# Patient Record
Sex: Male | Born: 1965 | Race: White | Hispanic: No | Marital: Single | Smoking: Current every day smoker
Health system: Southern US, Community
[De-identification: ages and names within clinical notes are randomized; demographics above are authoritative.]

## PROBLEM LIST (undated history)

## (undated) DIAGNOSIS — I10 Essential (primary) hypertension: Secondary | ICD-10-CM

## (undated) DIAGNOSIS — M519 Unspecified thoracic, thoracolumbar and lumbosacral intervertebral disc disorder: Secondary | ICD-10-CM

## (undated) DIAGNOSIS — E669 Obesity, unspecified: Secondary | ICD-10-CM

## (undated) DIAGNOSIS — G4733 Obstructive sleep apnea (adult) (pediatric): Secondary | ICD-10-CM

## (undated) DIAGNOSIS — E785 Hyperlipidemia, unspecified: Secondary | ICD-10-CM

## (undated) HISTORY — DX: Hyperlipidemia, unspecified: E78.5

## (undated) HISTORY — DX: Unspecified thoracic, thoracolumbar and lumbosacral intervertebral disc disorder: M51.9

## (undated) HISTORY — PX: HEMORROIDECTOMY: SUR656

## (undated) HISTORY — DX: Obstructive sleep apnea (adult) (pediatric): G47.33

## (undated) HISTORY — DX: Essential (primary) hypertension: I10

## (undated) HISTORY — DX: Obesity, unspecified: E66.9

---

## 2003-01-28 ENCOUNTER — Encounter (INDEPENDENT_AMBULATORY_CARE_PROVIDER_SITE_OTHER): Payer: Self-pay | Admitting: Specialist

## 2003-01-28 ENCOUNTER — Observation Stay (HOSPITAL_COMMUNITY): Admission: EM | Admit: 2003-01-28 | Discharge: 2003-01-29 | Payer: Self-pay | Admitting: Emergency Medicine

## 2009-08-19 ENCOUNTER — Ambulatory Visit (HOSPITAL_COMMUNITY): Admission: RE | Admit: 2009-08-19 | Discharge: 2009-08-19 | Payer: Self-pay | Admitting: Gastroenterology

## 2010-11-19 ENCOUNTER — Emergency Department (HOSPITAL_COMMUNITY)
Admission: EM | Admit: 2010-11-19 | Discharge: 2010-11-19 | Payer: Self-pay | Source: Home / Self Care | Admitting: Emergency Medicine

## 2010-11-22 LAB — CBC
HCT: 45.8 % (ref 39.0–52.0)
Hemoglobin: 16 g/dL (ref 13.0–17.0)
MCH: 32.7 pg (ref 26.0–34.0)
MCHC: 34.9 g/dL (ref 30.0–36.0)
MCV: 93.7 fL (ref 78.0–100.0)
Platelets: 242 10*3/uL (ref 150–400)
RBC: 4.89 MIL/uL (ref 4.22–5.81)
RDW: 13.6 % (ref 11.5–15.5)
WBC: 15.9 10*3/uL — ABNORMAL HIGH (ref 4.0–10.5)

## 2010-11-22 LAB — DIFFERENTIAL
Basophils Absolute: 0.1 10*3/uL (ref 0.0–0.1)
Basophils Relative: 1 % (ref 0–1)
Eosinophils Absolute: 0.2 10*3/uL (ref 0.0–0.7)
Eosinophils Relative: 1 % (ref 0–5)
Lymphocytes Relative: 14 % (ref 12–46)
Lymphs Abs: 2.2 10*3/uL (ref 0.7–4.0)
Monocytes Absolute: 1.5 10*3/uL — ABNORMAL HIGH (ref 0.1–1.0)
Monocytes Relative: 9 % (ref 3–12)
Neutro Abs: 12 10*3/uL — ABNORMAL HIGH (ref 1.7–7.7)
Neutrophils Relative %: 75 % (ref 43–77)

## 2010-11-22 LAB — BASIC METABOLIC PANEL
BUN: 12 mg/dL (ref 6–23)
CO2: 27 mEq/L (ref 19–32)
Calcium: 9.8 mg/dL (ref 8.4–10.5)
Chloride: 101 mEq/L (ref 96–112)
Creatinine, Ser: 0.69 mg/dL (ref 0.4–1.5)
GFR calc Af Amer: >60 mL/min (ref >60)
GFR calc non Af Amer: >60 mL/min (ref >60)
Glucose, Bld: 102 mg/dL — ABNORMAL HIGH (ref 70–99)
Potassium: 3.7 mEq/L (ref 3.5–5.1)
Sodium: 138 mEq/L (ref 135–145)

## 2010-11-22 LAB — RAPID STREP SCREEN (MED CTR MEBANE ONLY): Streptococcus, Group A Screen (Direct): NEGATIVE

## 2011-03-17 NOTE — Op Note (Signed)
   NAME:  Matthew Valencia, Matthew Valencia NO.:  [id number]   MEDICAL RECORD NO.:  [id number]                   PATIENT TYPE:  OBV   LOCATION:  0103                                 FACILITY:  [facility]   PHYSICIAN:  Lorre Munroe., M.D.            DATE OF BIRTH:  1965-11-29   DATE OF PROCEDURE:  01/28/2003  DATE OF DISCHARGE:                                 OPERATIVE REPORT   PREOPERATIVE DIAGNOSIS:  Severe thrombosed and necrotic compound  hemorrhoids.   POSTOPERATIVE DIAGNOSIS:  Severe thrombosed and necrotic compound  hemorrhoids.   PROCEDURE:  Hemorrhoidectomy.   SURGEON:  Lebron Conners, M.D.   ANESTHESIA:  General and local.   DESCRIPTION OF PROCEDURE:  After the patient was monitored and anesthetized  and had routine preparation and draping of the perineum, I very liberally  infused long-acting local anesthetic into the perianal and intra-anal  regions and into the hemorrhoids to be removed.  There were two extremely  large, very edematous and thrombosed hemorrhoids, which were compound type  and there was necrotic anoderm and perhaps a little necrotic distal rectal  mucosa associated with each hemorrhoid.  I did a digital exam and found that  there was no impaction.  The distal rectal mucosal looked normal on  anoscopic exam.  I elliptically excised each hemorrhoid and further removed  small blood clots which remained behind on each side, and got good  hemostasis with the Bovie, cutting right down to the internal sphincter on  each side.  Then I advanced the mucosa toward the anal verge and sewed it to  the anoderm and perianal skin with 2-0 chromic suture.  I made sure that  there was no stricture of the anus due to this closure.  Hemostasis was  good.  I applied a small bandage and completed the operation after injecting  a little bit more local anesthetic.  The patient tolerated it well.                                               Lorre Munroe., M.D.    Jodi Marble  D:  01/28/2003  T:  01/29/2003  Job:  [id number]

## 2013-07-31 ENCOUNTER — Ambulatory Visit
Admission: RE | Admit: 2013-07-31 | Discharge: 2013-07-31 | Disposition: A | Discharge status: Home / Self Care | Payer: BC Managed Care – PPO | Source: Ambulatory Visit | Attending: Internal Medicine | Admitting: Internal Medicine

## 2013-07-31 ENCOUNTER — Other Ambulatory Visit: Payer: Self-pay | Admitting: Internal Medicine

## 2013-07-31 DIAGNOSIS — J189 Pneumonia, unspecified organism: Secondary | ICD-10-CM

## 2014-01-15 ENCOUNTER — Ambulatory Visit
Admission: RE | Admit: 2014-01-15 | Discharge: 2014-01-15 | Disposition: A | Discharge status: Home / Self Care | Payer: BC Managed Care – PPO | Source: Ambulatory Visit | Attending: Internal Medicine | Admitting: Internal Medicine

## 2014-01-15 ENCOUNTER — Other Ambulatory Visit: Payer: Self-pay | Admitting: Internal Medicine

## 2014-01-15 DIAGNOSIS — M549 Dorsalgia, unspecified: Secondary | ICD-10-CM

## 2014-03-26 ENCOUNTER — Other Ambulatory Visit: Payer: Self-pay | Admitting: Internal Medicine

## 2014-03-26 DIAGNOSIS — M545 Low back pain, unspecified: Secondary | ICD-10-CM

## 2014-03-30 ENCOUNTER — Ambulatory Visit
Admission: RE | Admit: 2014-03-30 | Discharge: 2014-03-30 | Disposition: A | Discharge status: Home / Self Care | Payer: BC Managed Care – PPO | Source: Ambulatory Visit | Attending: Internal Medicine | Admitting: Internal Medicine

## 2014-03-30 DIAGNOSIS — M545 Low back pain, unspecified: Secondary | ICD-10-CM

## 2016-03-13 ENCOUNTER — Other Ambulatory Visit: Payer: Self-pay | Admitting: Internal Medicine

## 2016-03-13 DIAGNOSIS — R634 Abnormal weight loss: Secondary | ICD-10-CM

## 2016-03-13 DIAGNOSIS — R142 Eructation: Secondary | ICD-10-CM

## 2016-03-13 DIAGNOSIS — R109 Unspecified abdominal pain: Secondary | ICD-10-CM

## 2016-03-13 DIAGNOSIS — R630 Anorexia: Secondary | ICD-10-CM

## 2016-03-16 ENCOUNTER — Other Ambulatory Visit: Payer: Self-pay

## 2016-03-20 ENCOUNTER — Ambulatory Visit
Admission: RE | Admit: 2016-03-20 | Discharge: 2016-03-20 | Disposition: A | Discharge status: Home / Self Care | Payer: BLUE CROSS/BLUE SHIELD | Source: Ambulatory Visit | Attending: Internal Medicine | Admitting: Internal Medicine

## 2016-03-20 DIAGNOSIS — R630 Anorexia: Secondary | ICD-10-CM

## 2016-03-20 DIAGNOSIS — R142 Eructation: Secondary | ICD-10-CM

## 2016-03-20 DIAGNOSIS — R634 Abnormal weight loss: Secondary | ICD-10-CM

## 2016-03-20 DIAGNOSIS — R109 Unspecified abdominal pain: Secondary | ICD-10-CM

## 2021-02-15 ENCOUNTER — Other Ambulatory Visit: Payer: Self-pay | Admitting: Internal Medicine

## 2021-02-15 ENCOUNTER — Ambulatory Visit
Admission: RE | Admit: 2021-02-15 | Discharge: 2021-02-15 | Disposition: A | Discharge status: Home / Self Care | Payer: BLUE CROSS/BLUE SHIELD | Source: Ambulatory Visit | Attending: Internal Medicine | Admitting: Internal Medicine

## 2021-02-15 DIAGNOSIS — I10 Essential (primary) hypertension: Secondary | ICD-10-CM | POA: Diagnosis not present

## 2021-02-15 DIAGNOSIS — Z0000 Encounter for general adult medical examination without abnormal findings: Secondary | ICD-10-CM | POA: Diagnosis not present

## 2021-02-15 DIAGNOSIS — R0602 Shortness of breath: Secondary | ICD-10-CM

## 2021-02-15 DIAGNOSIS — G4700 Insomnia, unspecified: Secondary | ICD-10-CM | POA: Diagnosis not present

## 2021-02-15 DIAGNOSIS — E782 Mixed hyperlipidemia: Secondary | ICD-10-CM | POA: Diagnosis not present

## 2021-02-15 DIAGNOSIS — M519 Unspecified thoracic, thoracolumbar and lumbosacral intervertebral disc disorder: Secondary | ICD-10-CM | POA: Diagnosis not present

## 2021-02-19 ENCOUNTER — Encounter: Payer: Self-pay | Admitting: Cardiology

## 2021-02-19 DIAGNOSIS — R0602 Shortness of breath: Secondary | ICD-10-CM | POA: Insufficient documentation

## 2021-02-19 NOTE — Progress Notes (Signed)
Cardiology Office Note   Date:  02/21/2021   ID:  Matthew Valencia, DOB 04-Sep-1966, MRN [id number]  PCP:  Matthew Housekeeper, MD  Cardiologist:   No primary care provider on file. Referring:  Matthew Housekeeper, MD  Chief Complaint  Patient presents with  . Shortness of Breath      History of Present Illness: Matthew Valencia is a 55 y.o. male who is referred for evaluation of SOB.  He was referred by Matthew Housekeeper, MD  The patient has no past cardiac history but he does have significant cardiovascular risk factors.  He did have COVID.  This was in February.  He was quite sick with this though not hospitalized.  He had had his vaccinations.  I do see a chest x-ray that was done by his primary provider with some evidence of pulmonary vascular congestion and volume overload.  This was on  02/15/2021  He said that since COVID he has had dyspnea doing things like climbing a flight of stairs.  He is more short of breath doing stuff in his yard.  He may be having a little bit of PND or orthopnea.  He was given some diuretic a few days ago but he does not think this made a significant difference.  He is not describing cough fevers or chills.  He does still smoke cigarettes.  He said he has not had any symptoms prior to COVID.  He is not describing neck or arm discomfort.  He occasionally gets some chest pressure when he climbs a flight of stairs but this is not routine.  He has never had any other cardiac testing.  They tried to do a treadmill test years ago but he is back precluded doing this.  Past Medical History:  Diagnosis Date  . Dyslipidemia   . Hypertension   . Lumbar disc disease   . Obesity   . Obstructive sleep apnea    Does not use CPAP secondary to cost    Past Surgical History:  Procedure Laterality Date  . HEMORROIDECTOMY       Current Outpatient Medications  Medication Sig Dispense Refill  . albuterol (VENTOLIN HFA) 108 (90 Base) MCG/ACT inhaler As Needed    . ALPRAZolam  (XANAX) 0.5 MG tablet Take 0.25-0.5 mg by mouth 2 (two) times daily as needed (As Needed).    Marland Kitchen escitalopram (LEXAPRO) 10 MG tablet Take 1 tablet by mouth daily.    . furosemide (LASIX) 20 MG tablet Take 20 mg by mouth daily.     No current facility-administered medications for this visit.    Allergies:   Other and Trazodone hcl    Social History:  The patient  reports that he has been smoking cigarettes. He has a 10.00 pack-year smoking history. He has never used smokeless tobacco. He reports current alcohol use. He reports that he does not use drugs.   Family History:  The patient's family history includes Coronary artery disease in an other family member; Coronary artery disease (age of onset: 70) in his father; Diabetes in his mother; Heart failure in an other family member; Lung cancer in his father.    ROS:  Please see the history of present illness.   Otherwise, review of systems are positive for back pain.   All other systems are reviewed and negative.    PHYSICAL EXAM: VS:  BP 128/90   Pulse 69   Ht 6' (1.829 m)   Wt 214 lb 3.2 oz (97.2 kg)  BMI 29.05 kg/m  , BMI Body mass index is 29.05 kg/m. GENERAL:  Well appearing HEENT:  Pupils equal round and reactive, fundi not visualized, oral mucosa unremarkable NECK:  No jugular venous distention, waveform within normal limits, carotid upstroke brisk and symmetric, no bruits, no thyromegaly LYMPHATICS:  No cervical, inguinal adenopathy LUNGS:  Clear to auscultation bilaterally BACK:  No CVA tenderness CHEST:  Unremarkable HEART:  PMI not displaced or sustained,S1 and S2 within normal limits, no S3, no S4, no clicks, no rubs, no murmurs ABD:  Flat, positive bowel sounds normal in frequency in pitch, no bruits, no rebound, no guarding, no midline pulsatile mass, no hepatomegaly, no splenomegaly EXT:  2 plus pulses throughout, no edema, no cyanosis no clubbing SKIN:  No rashes no nodules NEURO:  Cranial nerves II through XII  grossly intact, motor grossly intact throughout PSYCH:  Cognitively intact, oriented to person place and time    EKG:  EKG is ordered today. The ekg ordered today demonstrates rate 69, axis within normal limits, short PR interval with possible ectopic atrial rhythm   Recent Labs: No results found for requested labs within last 8760 hours.    Lipid Panel No results found for: CHOL, TRIG, HDL, CHOLHDL, VLDL, LDLCALC, LDLDIRECT    Wt Readings from Last 3 Encounters:  02/21/21 214 lb 3.2 oz (97.2 kg)      Other studies Reviewed: Additional studies/ records that were reviewed today include: Labs. Review of the above records demonstrates:  Please see elsewhere in the note.     ASSESSMENT AND PLAN:  SOB:   I am going to check an echocardiogram though I do not strongly suspect cardiomyopathy.  This might be a pulmonary issue and this might be suggested once we done the work-up as below.  I am also going to screen for obstructive coronary disease.  I am going to start with a coronary calcium score which will then determine further testing.  Of note he would not be able walk on a treadmill with a think I need a stress test.  Doing a calcium score will also help determine goals of therapy.  DYSLIPIDEMIA: He is going to have a lipid profile done.  His last 1 was nonfasting.  He did have elevated triglycerides of 533 with an LDL of 121.  He might need a direct LDL.  I be happy to review this and make determinations of goals of therapy based on these results.    HTN: Blood pressure is controlled.  No change in therapy.      Current medicines are reviewed at length with the patient today.  The patient does not have concerns regarding medicines.  The following changes have been made:  no change  Labs/ tests ordered today include:   Orders Placed This Encounter  Procedures  . CT CARDIAC SCORING (SELF PAY ONLY)  . EKG 12-Lead  . ECHOCARDIOGRAM COMPLETE     Disposition:   FU with me  in 6 months   Signed, Rollene Rotunda, MD  02/21/2021 12:07 PM    [facility]

## 2021-02-21 ENCOUNTER — Ambulatory Visit (INDEPENDENT_AMBULATORY_CARE_PROVIDER_SITE_OTHER): Payer: BC Managed Care – PPO | Admitting: Cardiology

## 2021-02-21 ENCOUNTER — Other Ambulatory Visit: Payer: Self-pay

## 2021-02-21 ENCOUNTER — Encounter: Payer: Self-pay | Admitting: Cardiology

## 2021-02-21 VITALS — BP 128/90 | HR 69 | Ht 72.0 in | Wt 214.2 lb

## 2021-02-21 DIAGNOSIS — R0602 Shortness of breath: Secondary | ICD-10-CM

## 2021-02-21 NOTE — Patient Instructions (Addendum)
Medication Instructions:  Your physician recommends that you continue on your current medications as directed. Please refer to the Current Medication list given to you today.  *If you need a refill on your cardiac medications before your next appointment, please call your pharmacy*   Lab Work: NONE ordered at this time of appointment   If you have labs (blood work) drawn today and your tests are completely normal, you will receive your results only by: Marland Kitchen MyChart Message (if you have MyChart) OR . A paper copy in the mail If you have any lab test that is abnormal or we need to change your treatment, we will call you to review the results.  Testing/Procedures: Your physician has requested that you have an echocardiogram. Echocardiography is a painless test that uses sound waves to create images of your heart. It provides your doctor with information about the size and shape of your heart and how well your heart's chambers and valves are working. This procedure takes approximately one hour. There are no restrictions for this procedure. This test is performed at [address], [place], [place] [address]   Please schedule for 3-4 weeks  Dr. Antoine Poche has ordered a CT coronary calcium score. This test is done at [address]. This is $99 out of pocket.   Coronary CalciumScan A coronary calcium scan is an imaging test used to look for deposits of calcium and other fatty materials (plaques) in the inner lining of the blood vessels of the heart (coronary arteries). These deposits of calcium and plaques can partly clog and narrow the coronary arteries without producing any symptoms or warning signs. This puts a person at risk for a heart attack. This test can detect these deposits before symptoms develop. Tell a health care provider about:  Any allergies you have.  All medicines you are taking, including vitamins, herbs, eye drops, creams, and over-the-counter  medicines.  Any problems you or family members have had with anesthetic medicines.  Any blood disorders you have.  Any surgeries you have had.  Any medical conditions you have.  Whether you are pregnant or may be pregnant. What are the risks? Generally, this is a safe procedure. However, problems may occur, including:  Harm to a pregnant woman and her unborn baby. This test involves the use of radiation. Radiation exposure can be dangerous to a pregnant woman and her unborn baby. If you are pregnant, you generally should not have this procedure done.  Slight increase in the risk of cancer. This is because of the radiation involved in the test. What happens before the procedure? No preparation is needed for this procedure. What happens during the procedure?  You will undress and remove any jewelry around your neck or chest.  You will put on a hospital gown.  Sticky electrodes will be placed on your chest. The electrodes will be connected to an electrocardiogram (ECG) machine to record a tracing of the electrical activity of your heart.  A CT scanner will take pictures of your heart. During this time, you will be asked to lie still and hold your breath for 2-3 seconds while a picture of your heart is being taken. The procedure may vary among health care providers and hospitals. What happens after the procedure?  You can get dressed.  You can return to your normal activities.  It is up to you to get the results of your test. Ask your health care provider, or the department that  is doing the test, when your results will be ready. Summary  A coronary calcium scan is an imaging test used to look for deposits of calcium and other fatty materials (plaques) in the inner lining of the blood vessels of the heart (coronary arteries).  Generally, this is a safe procedure. Tell your health care provider if you are pregnant or may be pregnant.  No preparation is needed for this  procedure.  A CT scanner will take pictures of your heart.  You can return to your normal activities after the scan is done. This information is not intended to replace advice given to you by your health care provider. Make sure you discuss any questions you have with your health care provider. Document Released: 04/13/2008 Document Revised: 09/04/2016 Document Reviewed: 09/04/2016 Elsevier Interactive Patient Education  2017 [employer].     Follow-Up: At [facility], you and your health needs are our priority.  As part of our continuing mission to provide you with exceptional heart care, we have created designated Provider Care Teams.  These Care Teams include your primary Cardiologist (physician) and Advanced Practice Providers (APPs -  Physician Assistants and Nurse Practitioners) who all work together to provide you with the care you need, when you need it.  We recommend signing up for the patient portal called "MyChart".  Sign up information is provided on this After Visit Summary.  MyChart is used to connect with patients for Virtual Visits (Telemedicine).  Patients are able to view lab/test results, encounter notes, upcoming appointments, etc.  Non-urgent messages can be sent to your provider as well.   To learn more about what you can do with MyChart, go to [email or web address].    Your next appointment:   6 month(s)  The format for your next appointment:   In Person  Provider:   Rollene Rotunda, MD  Other Instructions

## 2021-02-22 DIAGNOSIS — Z13220 Encounter for screening for lipoid disorders: Secondary | ICD-10-CM | POA: Diagnosis not present

## 2021-02-22 DIAGNOSIS — R0602 Shortness of breath: Secondary | ICD-10-CM | POA: Diagnosis not present

## 2021-02-22 DIAGNOSIS — Z125 Encounter for screening for malignant neoplasm of prostate: Secondary | ICD-10-CM | POA: Diagnosis not present

## 2021-03-21 ENCOUNTER — Ambulatory Visit (HOSPITAL_COMMUNITY): Payer: BC Managed Care – PPO | Attending: Cardiovascular Disease

## 2021-03-21 ENCOUNTER — Ambulatory Visit (INDEPENDENT_AMBULATORY_CARE_PROVIDER_SITE_OTHER)
Admission: RE | Admit: 2021-03-21 | Discharge: 2021-03-21 | Disposition: A | Discharge status: Home / Self Care | Payer: Self-pay | Source: Ambulatory Visit | Attending: Cardiology | Admitting: Cardiology

## 2021-03-21 ENCOUNTER — Other Ambulatory Visit: Payer: Self-pay

## 2021-03-21 DIAGNOSIS — R0602 Shortness of breath: Secondary | ICD-10-CM | POA: Diagnosis not present

## 2021-03-21 LAB — ECHOCARDIOGRAM COMPLETE
Area-P 1/2: 2.62 cm2
S' Lateral: 2.7 cm

## 2021-03-23 ENCOUNTER — Encounter: Payer: Self-pay | Admitting: *Deleted

## 2021-03-23 ENCOUNTER — Telehealth: Payer: Self-pay | Admitting: *Deleted

## 2021-03-23 DIAGNOSIS — R0602 Shortness of breath: Secondary | ICD-10-CM

## 2021-03-23 NOTE — Telephone Encounter (Signed)
Spoke with pt, aware of results, all questions answered. lexiscan scheduled and instructions mailed to the patient. Follow up scheduled

## 2021-03-23 NOTE — Telephone Encounter (Addendum)
-----   Message from Rollene Rotunda, MD sent at 03/21/2021  9:45 PM EDT ----- Echo shows a normal EF.  No significant valvular abnormalities.  See results of coronary calcium score.  Call Mr. Grissinger with the results and send results to [name], MD   Rollene Rotunda, MD  Regis Bill B, LPN Elevated coronary calcium. He has this and SOB. Please schedule a Lexiscan Myoview. Please make sure he has follow up with me after this. Call Mr. Pasion with the results and send results to [name], MD    Left message for pt to call

## 2021-03-24 DIAGNOSIS — M5412 Radiculopathy, cervical region: Secondary | ICD-10-CM | POA: Diagnosis not present

## 2021-04-05 ENCOUNTER — Telehealth (HOSPITAL_COMMUNITY): Payer: Self-pay | Admitting: *Deleted

## 2021-04-05 ENCOUNTER — Encounter (HOSPITAL_COMMUNITY): Payer: Self-pay | Admitting: *Deleted

## 2021-04-05 NOTE — Telephone Encounter (Signed)
Instruction letter sent via [employer] outlining instructions for upcoming stress test on 04/11/21 @ 7:45.

## 2021-04-11 ENCOUNTER — Ambulatory Visit (HOSPITAL_COMMUNITY): Payer: BC Managed Care – PPO | Attending: Cardiology

## 2021-04-11 ENCOUNTER — Other Ambulatory Visit: Payer: Self-pay

## 2021-04-11 DIAGNOSIS — R0602 Shortness of breath: Secondary | ICD-10-CM | POA: Diagnosis not present

## 2021-04-11 LAB — MYOCARDIAL PERFUSION IMAGING
LV dias vol: 72 mL (ref 62–150)
LV sys vol: 31 mL
Peak HR: 93 {beats}/min
Rest HR: 71 {beats}/min
SDS: 0
SRS: 0
SSS: 0
TID: 0.94

## 2021-04-11 MED ORDER — TECHNETIUM TC 99M TETROFOSMIN IV KIT
11.0000 | PACK | Freq: Once | INTRAVENOUS | Status: AC | PRN
  Administered 2021-04-11: 11 via INTRAVENOUS
  Filled 2021-04-11: qty 11

## 2021-04-11 MED ORDER — REGADENOSON 0.4 MG/5ML IV SOLN
0.4000 mg | Freq: Once | INTRAVENOUS | Status: AC
  Administered 2021-04-11: 0.4 mg via INTRAVENOUS

## 2021-04-11 MED ORDER — TECHNETIUM TC 99M TETROFOSMIN IV KIT
30.4000 | PACK | Freq: Once | INTRAVENOUS | Status: AC | PRN
  Administered 2021-04-11: 30.4 via INTRAVENOUS
  Filled 2021-04-11: qty 31

## 2021-04-11 MED ORDER — AMINOPHYLLINE 25 MG/ML IV SOLN
75.0000 mg | Freq: Once | INTRAVENOUS | Status: AC
  Administered 2021-04-11: 75 mg via INTRAVENOUS

## 2021-04-13 ENCOUNTER — Telehealth: Payer: Self-pay | Admitting: *Deleted

## 2021-04-13 ENCOUNTER — Other Ambulatory Visit: Payer: Self-pay | Admitting: *Deleted

## 2021-04-13 DIAGNOSIS — R931 Abnormal findings on diagnostic imaging of heart and coronary circulation: Secondary | ICD-10-CM

## 2021-04-13 NOTE — Progress Notes (Signed)
lipid

## 2021-04-13 NOTE — Telephone Encounter (Addendum)
-----   Message from Rollene Rotunda, MD sent at 04/13/2021  8:08 AM EDT ----- No evidence of ischemia.  EF was normal on echo.  I would like to see his most recent lipid profile.  He had significant calcium.   Did he get lipids done?  Call Mr. Dacosta with the results and send results to [name], MD   Left message for pt to call  Order placed for lipid panel and slip mailed to the patient.

## 2021-04-13 NOTE — Telephone Encounter (Signed)
pt aware of results  He reports he had lab work about 2 weeks ago with his medical doctor.  Will call and get results faxed to [place].

## 2021-04-17 DIAGNOSIS — I10 Essential (primary) hypertension: Secondary | ICD-10-CM | POA: Insufficient documentation

## 2021-04-17 DIAGNOSIS — E785 Hyperlipidemia, unspecified: Secondary | ICD-10-CM | POA: Insufficient documentation

## 2021-04-17 NOTE — Progress Notes (Signed)
Cardiology Office Note   Date:  04/18/2021   ID:  Matthew Valencia, DOB April 04, 1966, MRN [id number]  PCP:  [name], MD  Cardiologist:   None Referring:  [name], MD  Chief Complaint  Patient presents with   Shortness of Breath       History of Present Illness: Matthew Valencia is a 55 y.o. male who is referred for evaluation of SOB.  He was referred by [name], MD  The patient has no past cardiac history but he does have significant cardiovascular risk factors.  He did have COVID.  This was in February.  He was quite sick with this though not hospitalized.  He had had his vaccinations.  I do see a chest x-ray that was done by his primary provider with some evidence of pulmonary vascular congestion and volume overload.  This was on  02/15/2021  He has had post COVID dyspnea.  Coronary calcium score was elevated at the 94th percentile.    There was a small defect of mild severity in the anteroseptal location.  This was not reversible.  Echo showed a NL EF and no other abnormalities.    He continues to get short of breath with activities such as climbing a flight of stairs or walking moderate speed on level ground.  He is not having any new shortness of breath, PND or orthopnea.  He is not having any new palpitations, presyncope or syncope.  He is having no cough fevers or chills.   Past Medical History:  Diagnosis Date   Dyslipidemia    Hypertension    Lumbar disc disease    Obesity    Obstructive sleep apnea    Does not use CPAP secondary to cost    Past Surgical History:  Procedure Laterality Date   HEMORROIDECTOMY       Current Outpatient Medications  Medication Sig Dispense Refill   albuterol (VENTOLIN HFA) 108 (90 Base) MCG/ACT inhaler As Needed     ALPRAZolam (XANAX) 0.5 MG tablet Take 0.25-0.5 mg by mouth 2 (two) times daily as needed (As Needed).     atorvastatin (LIPITOR) 40 MG tablet Take 1 tablet (40 mg total) by mouth daily. 90 tablet 3    escitalopram (LEXAPRO) 10 MG tablet Take 1 tablet by mouth daily.     furosemide (LASIX) 20 MG tablet Take 20 mg by mouth daily.     No current facility-administered medications for this visit.    Allergies:   Other and Trazodone hcl    ROS:  Please see the history of present illness.   Otherwise, review of systems are positive for back none.   All other systems are reviewed and negative.    PHYSICAL EXAM: VS:  BP (!) 150/84   Pulse 76   Ht 6' (1.829 m)   Wt 213 lb 9.6 oz (96.9 kg)   SpO2 95%   BMI 28.97 kg/m  , BMI Body mass index is 28.97 kg/m. GENERAL:  Well appearing NECK:  No jugular venous distention, waveform within normal limits, carotid upstroke brisk and symmetric, no bruits, no thyromegaly LUNGS:  Clear to auscultation bilaterally CHEST:  Unremarkable HEART:  PMI not displaced or sustained,S1 and S2 within normal limits, no S3, no S4, no clicks, no rubs, no murmurs ABD:  Flat, positive bowel sounds normal in frequency in pitch, no bruits, no rebound, no guarding, no midline pulsatile mass, no hepatomegaly, no splenomegaly EXT:  2 plus pulses throughout, no edema, no cyanosis no  clubbing   EKG:  EKG is not ordered today.   Recent Labs: No results found for requested labs within last 8760 hours.    Lipid Panel No results found for: CHOL, TRIG, HDL, CHOLHDL, VLDL, LDLCALC, LDLDIRECT    Wt Readings from Last 3 Encounters:  04/18/21 213 lb 9.6 oz (96.9 kg)  04/11/21 214 lb (97.1 kg)  02/21/21 214 lb 3.2 oz (97.2 kg)      Other studies Reviewed: Additional studies/ records that were reviewed today include: Lexiscan Myoview., echo, coronary calcium socre. Review of the above records demonstrates:  Please see elsewhere in the note.     ASSESSMENT AND PLAN:  SOB:    The patient does not have a clear cardiac etiology for his shortness of breath.  He did not have any high risk findings on his perfusion study.  There was some edema on his previous chest x-ray so  I will check a BNP level but he had a normal ejection fraction and no real evidence of diastolic dysfunction.  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  If the BNP level comes back normal I would not suggest further cardiovascular testing.  He really did not have improvement when he was prescribed diuretics.  Rather I would defer to his primary physician to consider pulmonary function testing or whether this could just be mechanical related to the hemidiaphragm.  Of course he needs to stop smoking.  We talked at length about this   DYSLIPIDEMIA:   LDL is 180.  Given his elevated coronary calcium score I talked him at length about diet.  I have asked him to restart Lipitor which he apparently was on previously at 40 mg and have a follow-up lipid with his primary provider.  HTN: Blood pressure is is elevated and needs to keep a blood pressure diary.   His blood pressure was not elevated at the last visit.    ELEVATED CORONARY CALCIUM: We talked about the need for primary risk reduction.  TOBACCO ABUSE: He understands need to stop smoking.  Current medicines are reviewed at length with the patient today.  The patient does not have concerns regarding medicines.  The following changes have been made:  no change  Labs/ tests ordered today include:   Orders Placed This Encounter  Procedures   Brain natriuretic peptide      Disposition:   FU with me in 6 months   Signed, Rollene Rotunda, MD  04/18/2021 12:40 PM    [facility]

## 2021-04-18 ENCOUNTER — Ambulatory Visit: Payer: BC Managed Care – PPO | Admitting: Cardiology

## 2021-04-18 ENCOUNTER — Encounter: Payer: Self-pay | Admitting: Cardiology

## 2021-04-18 ENCOUNTER — Other Ambulatory Visit: Payer: Self-pay

## 2021-04-18 VITALS — BP 150/84 | HR 76 | Ht 72.0 in | Wt 213.6 lb

## 2021-04-18 DIAGNOSIS — E785 Hyperlipidemia, unspecified: Secondary | ICD-10-CM | POA: Diagnosis not present

## 2021-04-18 DIAGNOSIS — I10 Essential (primary) hypertension: Secondary | ICD-10-CM

## 2021-04-18 DIAGNOSIS — R0602 Shortness of breath: Secondary | ICD-10-CM | POA: Diagnosis not present

## 2021-04-18 MED ORDER — ATORVASTATIN CALCIUM 40 MG PO TABS: 40.0000 mg | ORAL_TABLET | Freq: Every day | ORAL | 3 refills | Status: AC

## 2021-04-18 NOTE — Patient Instructions (Signed)
Medication Instructions:  Start LIPITOR 40 mg daily   *If you need a refill on your cardiac medications before your next appointment, please call your pharmacy*   Lab Work:  BNP today   If you have labs (blood work) drawn today and your tests are completely normal, you will receive your results only by: MyChart Message (if you have MyChart) OR A paper copy in the mail If you have any lab test that is abnormal or we need to change your treatment, we will call you to review the results.    Follow-Up: At [facility], you and your health needs are our priority.  As part of our continuing mission to provide you with exceptional heart care, we have created designated Provider Care Teams.  These Care Teams include your primary Cardiologist (physician) and Advanced Practice Providers (APPs -  Physician Assistants and Nurse Practitioners) who all work together to provide you with the care you need, when you need it.  We recommend signing up for the patient portal called "MyChart".  Sign up information is provided on this After Visit Summary.  MyChart is used to connect with patients for Virtual Visits (Telemedicine).  Patients are able to view lab/test results, encounter notes, upcoming appointments, etc.  Non-urgent messages can be sent to your provider as well.   To learn more about what you can do with MyChart, go to [email or web address].    Your next appointment:   2 year(s)  The format for your next appointment:   In Person  Provider:   Rollene Rotunda, MD

## 2021-04-19 LAB — BRAIN NATRIURETIC PEPTIDE: BNP: 26.5 pg/mL (ref 0.0–100.0)

## 2021-04-20 ENCOUNTER — Encounter: Payer: Self-pay | Admitting: *Deleted

## 2021-07-27 DIAGNOSIS — R931 Abnormal findings on diagnostic imaging of heart and coronary circulation: Secondary | ICD-10-CM | POA: Diagnosis not present

## 2021-07-27 DIAGNOSIS — M519 Unspecified thoracic, thoracolumbar and lumbosacral intervertebral disc disorder: Secondary | ICD-10-CM | POA: Diagnosis not present

## 2021-07-27 DIAGNOSIS — M549 Dorsalgia, unspecified: Secondary | ICD-10-CM | POA: Diagnosis not present

## 2021-10-07 ENCOUNTER — Ambulatory Visit
Admission: RE | Admit: 2021-10-07 | Discharge: 2021-10-07 | Disposition: A | Discharge status: Home / Self Care | Payer: BC Managed Care – PPO | Source: Ambulatory Visit | Attending: Physician Assistant | Admitting: Physician Assistant

## 2021-10-07 ENCOUNTER — Other Ambulatory Visit: Payer: Self-pay | Admitting: Physician Assistant

## 2021-10-07 DIAGNOSIS — M542 Cervicalgia: Secondary | ICD-10-CM

## 2021-10-07 DIAGNOSIS — R200 Anesthesia of skin: Secondary | ICD-10-CM

## 2021-10-07 DIAGNOSIS — M778 Other enthesopathies, not elsewhere classified: Secondary | ICD-10-CM

## 2021-10-07 DIAGNOSIS — M25511 Pain in right shoulder: Secondary | ICD-10-CM | POA: Diagnosis not present

## 2021-10-07 DIAGNOSIS — Z23 Encounter for immunization: Secondary | ICD-10-CM | POA: Diagnosis not present

## 2021-11-30 DIAGNOSIS — M542 Cervicalgia: Secondary | ICD-10-CM | POA: Diagnosis not present

## 2021-11-30 DIAGNOSIS — M778 Other enthesopathies, not elsewhere classified: Secondary | ICD-10-CM | POA: Diagnosis not present

## 2021-11-30 DIAGNOSIS — R200 Anesthesia of skin: Secondary | ICD-10-CM | POA: Diagnosis not present

## 2021-12-02 DIAGNOSIS — M542 Cervicalgia: Secondary | ICD-10-CM | POA: Diagnosis not present

## 2021-12-02 DIAGNOSIS — R200 Anesthesia of skin: Secondary | ICD-10-CM | POA: Diagnosis not present

## 2021-12-02 DIAGNOSIS — M778 Other enthesopathies, not elsewhere classified: Secondary | ICD-10-CM | POA: Diagnosis not present

## 2021-12-12 DIAGNOSIS — M778 Other enthesopathies, not elsewhere classified: Secondary | ICD-10-CM | POA: Diagnosis not present

## 2021-12-12 DIAGNOSIS — M542 Cervicalgia: Secondary | ICD-10-CM | POA: Diagnosis not present

## 2021-12-12 DIAGNOSIS — R200 Anesthesia of skin: Secondary | ICD-10-CM | POA: Diagnosis not present

## 2021-12-19 DIAGNOSIS — R200 Anesthesia of skin: Secondary | ICD-10-CM | POA: Diagnosis not present

## 2021-12-19 DIAGNOSIS — M778 Other enthesopathies, not elsewhere classified: Secondary | ICD-10-CM | POA: Diagnosis not present

## 2021-12-19 DIAGNOSIS — M542 Cervicalgia: Secondary | ICD-10-CM | POA: Diagnosis not present

## 2022-04-13 DIAGNOSIS — H6693 Otitis media, unspecified, bilateral: Secondary | ICD-10-CM | POA: Diagnosis not present

## 2022-04-13 DIAGNOSIS — F419 Anxiety disorder, unspecified: Secondary | ICD-10-CM | POA: Diagnosis not present

## 2022-05-26 DIAGNOSIS — M519 Unspecified thoracic, thoracolumbar and lumbosacral intervertebral disc disorder: Secondary | ICD-10-CM | POA: Diagnosis not present

## 2022-07-24 DIAGNOSIS — E782 Mixed hyperlipidemia: Secondary | ICD-10-CM | POA: Diagnosis not present

## 2022-07-24 DIAGNOSIS — F419 Anxiety disorder, unspecified: Secondary | ICD-10-CM | POA: Diagnosis not present

## 2022-07-24 DIAGNOSIS — Z125 Encounter for screening for malignant neoplasm of prostate: Secondary | ICD-10-CM | POA: Diagnosis not present

## 2022-07-24 DIAGNOSIS — Z0000 Encounter for general adult medical examination without abnormal findings: Secondary | ICD-10-CM | POA: Diagnosis not present

## 2022-07-24 DIAGNOSIS — M519 Unspecified thoracic, thoracolumbar and lumbosacral intervertebral disc disorder: Secondary | ICD-10-CM | POA: Diagnosis not present

## 2022-07-24 DIAGNOSIS — I10 Essential (primary) hypertension: Secondary | ICD-10-CM | POA: Diagnosis not present

## 2022-07-24 DIAGNOSIS — R7309 Other abnormal glucose: Secondary | ICD-10-CM | POA: Diagnosis not present

## 2022-07-24 DIAGNOSIS — Z23 Encounter for immunization: Secondary | ICD-10-CM | POA: Diagnosis not present

## 2022-07-25 ENCOUNTER — Other Ambulatory Visit: Payer: Self-pay | Admitting: Internal Medicine

## 2022-07-25 DIAGNOSIS — M5416 Radiculopathy, lumbar region: Secondary | ICD-10-CM

## 2022-07-28 DIAGNOSIS — M5090 Cervical disc disorder, unspecified, unspecified cervical region: Secondary | ICD-10-CM | POA: Diagnosis not present

## 2022-07-28 DIAGNOSIS — R519 Headache, unspecified: Secondary | ICD-10-CM | POA: Diagnosis not present

## 2022-07-28 DIAGNOSIS — F17200 Nicotine dependence, unspecified, uncomplicated: Secondary | ICD-10-CM | POA: Diagnosis not present

## 2022-07-31 ENCOUNTER — Other Ambulatory Visit: Payer: Self-pay | Admitting: Internal Medicine

## 2022-07-31 DIAGNOSIS — R519 Headache, unspecified: Secondary | ICD-10-CM

## 2022-07-31 DIAGNOSIS — M5090 Cervical disc disorder, unspecified, unspecified cervical region: Secondary | ICD-10-CM

## 2022-08-21 ENCOUNTER — Ambulatory Visit
Admission: RE | Admit: 2022-08-21 | Discharge: 2022-08-21 | Disposition: A | Discharge status: Home / Self Care | Payer: BC Managed Care – PPO | Source: Ambulatory Visit | Attending: Internal Medicine | Admitting: Internal Medicine

## 2022-08-21 DIAGNOSIS — M5416 Radiculopathy, lumbar region: Secondary | ICD-10-CM

## 2022-08-21 DIAGNOSIS — M5127 Other intervertebral disc displacement, lumbosacral region: Secondary | ICD-10-CM | POA: Diagnosis not present

## 2022-11-06 DIAGNOSIS — M5441 Lumbago with sciatica, right side: Secondary | ICD-10-CM | POA: Diagnosis not present

## 2022-11-06 DIAGNOSIS — G8929 Other chronic pain: Secondary | ICD-10-CM | POA: Diagnosis not present

## 2023-05-14 IMAGING — CT CT CARDIAC CORONARY ARTERY CALCIUM SCORE
3 series · 14 of 20 positions shown, 15 images · non-contrast
Comparison: None.
COMPARISON: None.

Addendum:
EXAM:
OVER-READ INTERPRETATION  CT CHEST

The following report is an over-read performed by radiologist Dr.
Sumaira Timko [REDACTED] on 03/21/2021. This
over-read does not include interpretation of cardiac or coronary
anatomy or pathology. The coronary calcium score interpretation by
the cardiologist is attached.
CLINICAL DATA: Cardiovascular Disease Risk stratification
Coronary Calcium Score
TECHNIQUE: A gated, non-contrast computed tomography scan of the heart was
performed using 3mm slice thickness. Axial images were analyzed on a
dedicated workstation. Calcium scoring of the coronary arteries was
performed using the Agatston method.

[Series 2: casc 3.0 bv41 2 bestdiast 73 % · axial · 0.42mm/px · z∈[-286,-196]mm · 4 of 50 slices shown, 5 images]
[im 10/50  vessel]
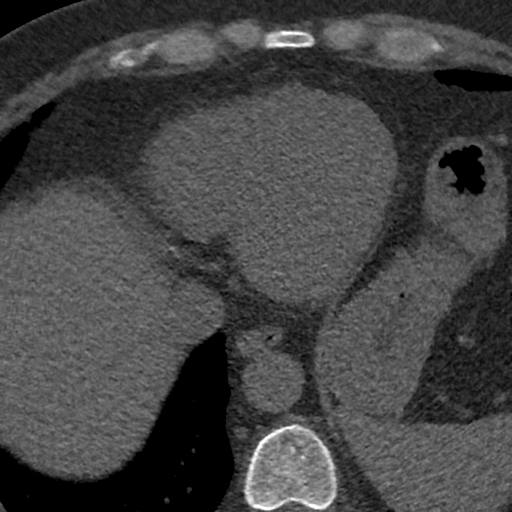
[im 10/50  lung]
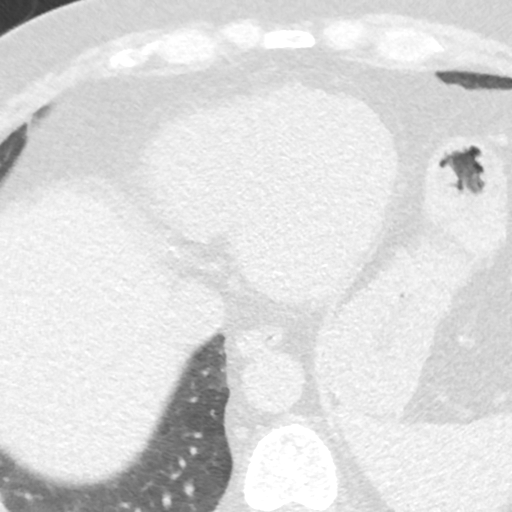
[im 20/50  vessel]
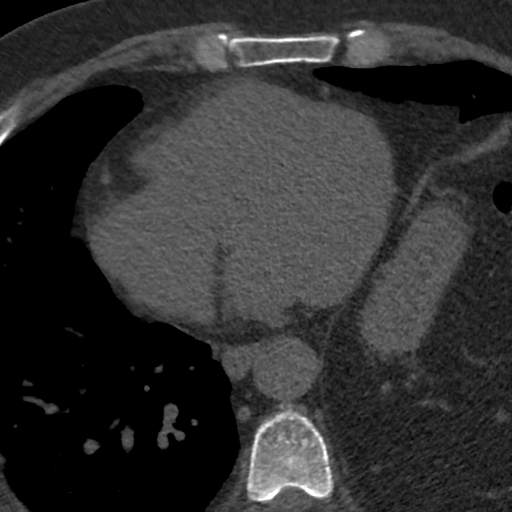
[im 30/50  vessel]
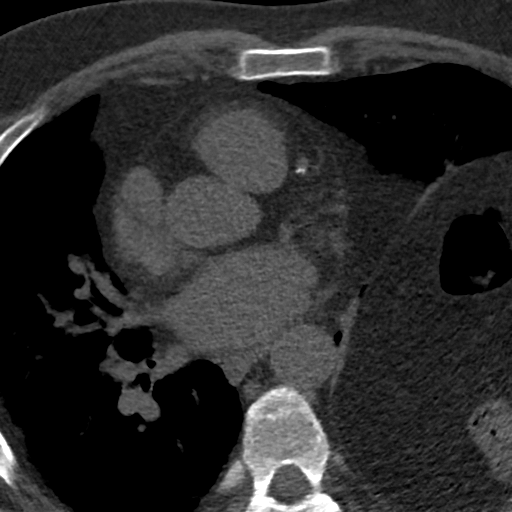
[im 40/50  vessel]
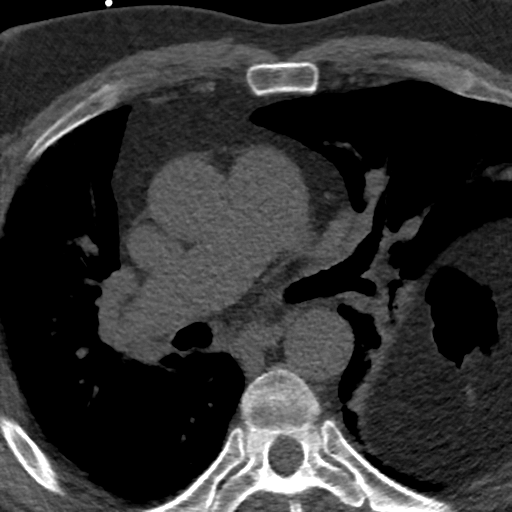

[Series 3: lung 74 % · axial · 0.76mm/px · z∈[-290,-194]mm · 5 of 50 slices shown]
[im 9/50  lung]
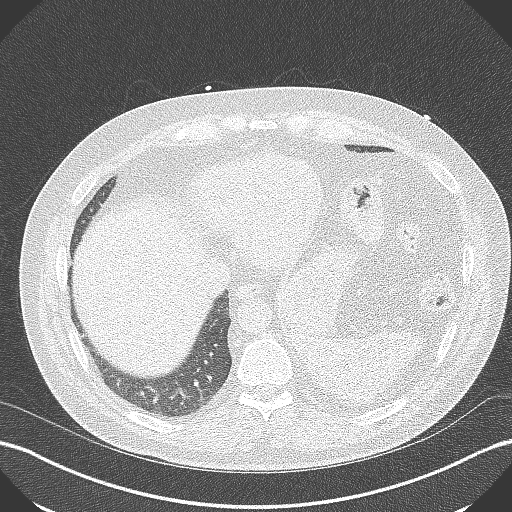
[im 17/50  lung]
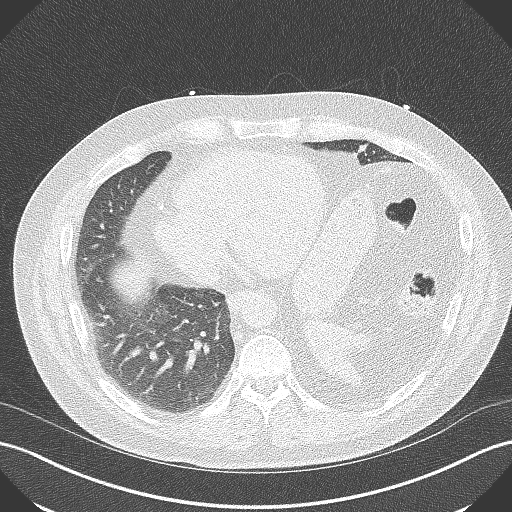
[im 25/50  lung]
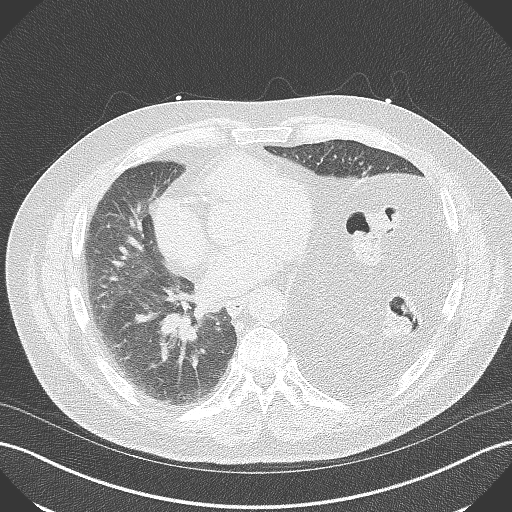
[im 33/50  lung]
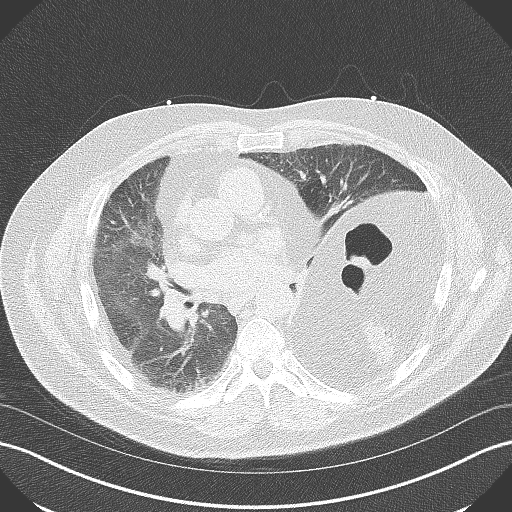
[im 41/50  lung]
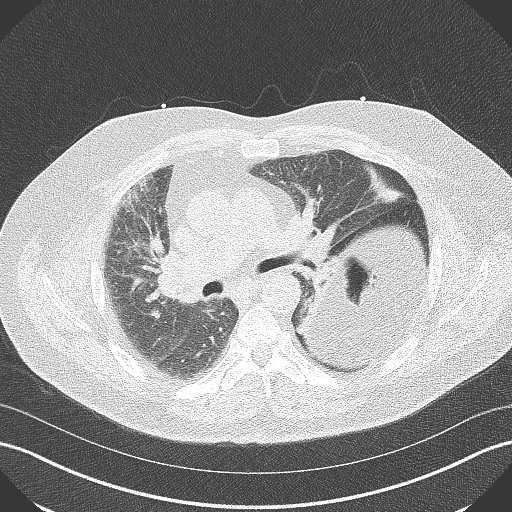

[Series 4: lung st 74 % · axial · 0.76mm/px · z∈[-290,-194]mm · 5 of 50 slices shown]
[im 9/50  lung]
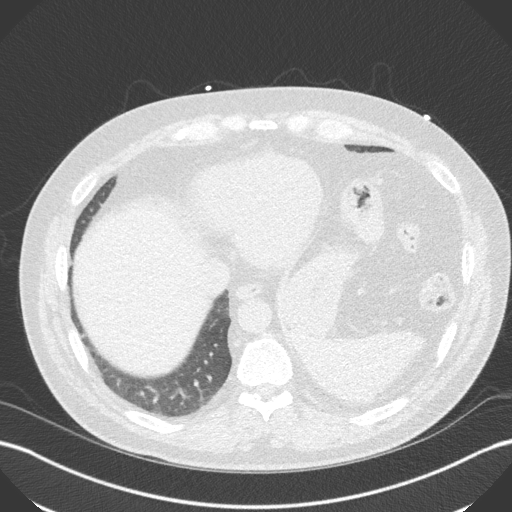
[im 17/50  lung]
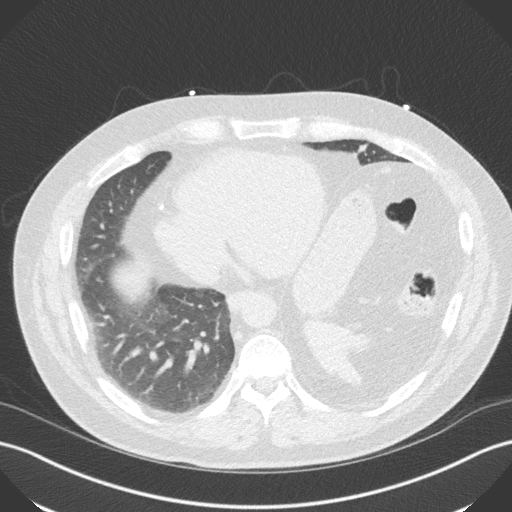
[im 25/50  lung]
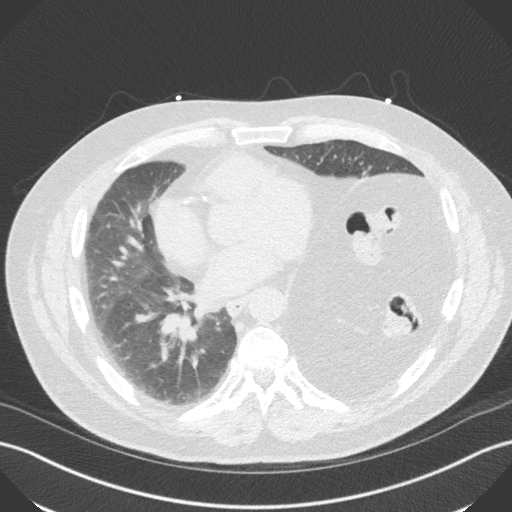
[im 33/50  lung]
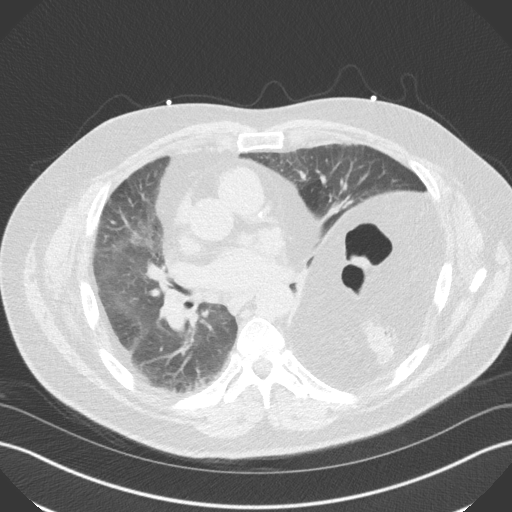
[im 41/50  lung]
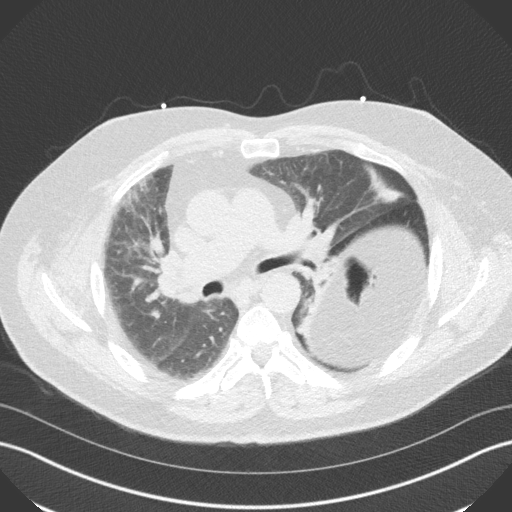

[14 of 20 positions shown; findings below may reference images not displayed]

FINDINGS: Atherosclerotic calcifications in the thoracic aorta. Severe
elevation of the left hemidiaphragm similar to prior studies based
on comparison with chest radiographs dating back to 7385. Within the
visualized portions of the thorax there are no suspicious appearing
pulmonary nodules or masses, there is no acute consolidative
airspace disease, no pleural effusions, no pneumothorax and no
lymphadenopathy. Visualized portions of the upper abdomen are
unremarkable. There are no aggressive appearing lytic or blastic
lesions noted in the visualized portions of the skeleton.
IMPRESSION: 1. Severe chronic elevation of the left hemidiaphragm again noted.
2.  Aortic Atherosclerosis (XJ7IB-KF5.5).
FINDINGS: Coronary arteries: Normal origins.

Coronary Calcium Score:

Left main: 0

Left anterior descending artery:

Left circumflex artery:

Right coronary artery:

Total: 310

Percentile: 94th

Pericardium: Normal.

Ascending Aorta: Normal caliber. Scattered calcifications throughout
the ascending aorta, aortic arch and descending aorta.

Non-cardiac: See separate report from [REDACTED].
IMPRESSION: Coronary calcium score of 310. This was 94th percentile for age-,
race-, and sex-matched controls.



If CAC=0, it is reasonable to withhold statin therapy and reassess
in 5 to 10 years, as long as higher risk conditions are absent
(diabetes mellitus, family history of premature CHD in first degree
relatives (males <55 years; females <65 years), cigarette smoking,
or LDL >=190 mg/dL).

If CAC is 1 to 99, it is reasonable to initiate statin therapy for
patients >=55 years of age.

If CAC is >=100 or >=75th percentile, it is reasonable to initiate
statin therapy at any age.

Cardiology referral should be considered for patients with CAC
scores >=400 or >=75th percentile.

*6662 AHA/ACC/AACVPR/AAPA/ABC/[place]/RONLOR/ARISSA/Golike/YOUNUS/MOATSHE/YUDI
Guideline on the Management of Blood Cholesterol: A Report of the
American College of Cardiology/American Heart Association Task Force
on Clinical Practice Guidelines. J Am Coll Cardiol.
4631;73(24):3911-3885.

*** End of Addendum ***
EXAM:
OVER-READ INTERPRETATION  CT CHEST

The following report is an over-read performed by radiologist Dr.
Sumaira Timko [REDACTED] on 03/21/2021. This
over-read does not include interpretation of cardiac or coronary
anatomy or pathology. The coronary calcium score interpretation by
the cardiologist is attached.
FINDINGS: Atherosclerotic calcifications in the thoracic aorta. Severe
elevation of the left hemidiaphragm similar to prior studies based
on comparison with chest radiographs dating back to 7385. Within the
visualized portions of the thorax there are no suspicious appearing
pulmonary nodules or masses, there is no acute consolidative
airspace disease, no pleural effusions, no pneumothorax and no
lymphadenopathy. Visualized portions of the upper abdomen are
unremarkable. There are no aggressive appearing lytic or blastic
lesions noted in the visualized portions of the skeleton.
IMPRESSION: 1. Severe chronic elevation of the left hemidiaphragm again noted.
2.  Aortic Atherosclerosis (XJ7IB-KF5.5).

## 2023-07-19 DIAGNOSIS — R5383 Other fatigue: Secondary | ICD-10-CM | POA: Diagnosis not present

## 2023-07-19 DIAGNOSIS — Z03818 Encounter for observation for suspected exposure to other biological agents ruled out: Secondary | ICD-10-CM | POA: Diagnosis not present

## 2023-07-19 DIAGNOSIS — R519 Headache, unspecified: Secondary | ICD-10-CM | POA: Diagnosis not present

## 2023-07-19 DIAGNOSIS — R197 Diarrhea, unspecified: Secondary | ICD-10-CM | POA: Diagnosis not present

## 2023-09-07 DIAGNOSIS — R7303 Prediabetes: Secondary | ICD-10-CM | POA: Diagnosis not present

## 2023-09-07 DIAGNOSIS — Z23 Encounter for immunization: Secondary | ICD-10-CM | POA: Diagnosis not present

## 2023-09-07 DIAGNOSIS — Z0000 Encounter for general adult medical examination without abnormal findings: Secondary | ICD-10-CM | POA: Diagnosis not present

## 2023-09-07 DIAGNOSIS — F419 Anxiety disorder, unspecified: Secondary | ICD-10-CM | POA: Diagnosis not present

## 2023-09-07 DIAGNOSIS — Z125 Encounter for screening for malignant neoplasm of prostate: Secondary | ICD-10-CM | POA: Diagnosis not present

## 2023-09-07 DIAGNOSIS — E782 Mixed hyperlipidemia: Secondary | ICD-10-CM | POA: Diagnosis not present

## 2023-09-07 DIAGNOSIS — I10 Essential (primary) hypertension: Secondary | ICD-10-CM | POA: Diagnosis not present

## 2023-10-08 DIAGNOSIS — F17210 Nicotine dependence, cigarettes, uncomplicated: Secondary | ICD-10-CM | POA: Diagnosis not present

## 2023-10-08 DIAGNOSIS — I10 Essential (primary) hypertension: Secondary | ICD-10-CM | POA: Diagnosis not present

## 2023-11-30 IMAGING — DX DG SHOULDER 1V*R*
1 series · 1 of 1 positions shown · non-contrast
Comparison: None.

CLINICAL DATA: Right shoulder pain 2-3 weeks.

EXAM:
RIGHT SHOULDER - 1 VIEW

[dg shoulder 1v right]
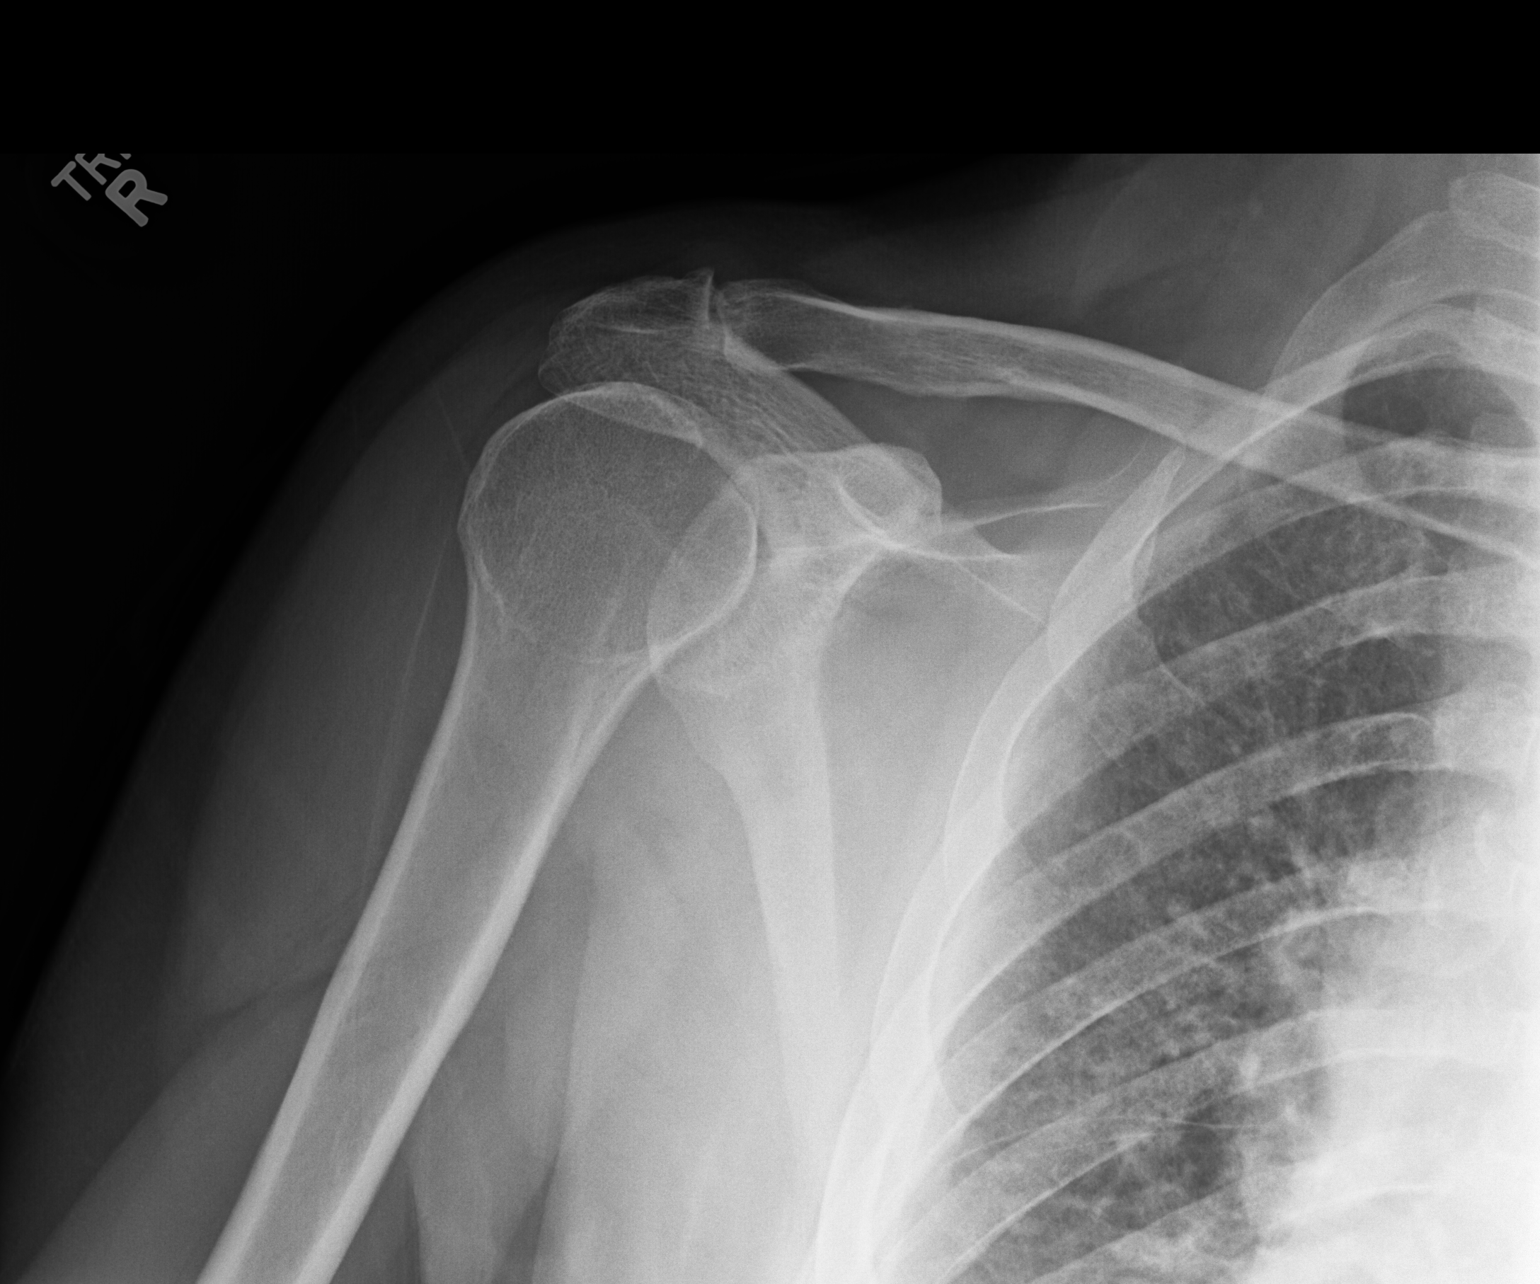

[1 of 1 positions shown; findings below may reference images not displayed]

FINDINGS: Minimal degenerative change of the AC joint. Glenohumeral joint is
unremarkable. No focal bony abnormality.
IMPRESSION: No acute findings.

## 2024-02-25 DIAGNOSIS — I10 Essential (primary) hypertension: Secondary | ICD-10-CM | POA: Diagnosis not present

## 2024-02-25 DIAGNOSIS — M519 Unspecified thoracic, thoracolumbar and lumbosacral intervertebral disc disorder: Secondary | ICD-10-CM | POA: Diagnosis not present

## 2024-02-25 DIAGNOSIS — R7303 Prediabetes: Secondary | ICD-10-CM | POA: Diagnosis not present

## 2024-02-25 DIAGNOSIS — E782 Mixed hyperlipidemia: Secondary | ICD-10-CM | POA: Diagnosis not present

## 2024-02-25 DIAGNOSIS — F419 Anxiety disorder, unspecified: Secondary | ICD-10-CM | POA: Diagnosis not present

## 2024-07-28 DIAGNOSIS — G4700 Insomnia, unspecified: Secondary | ICD-10-CM | POA: Diagnosis not present

## 2024-07-28 DIAGNOSIS — R7303 Prediabetes: Secondary | ICD-10-CM | POA: Diagnosis not present

## 2024-07-28 DIAGNOSIS — E782 Mixed hyperlipidemia: Secondary | ICD-10-CM | POA: Diagnosis not present

## 2024-07-28 DIAGNOSIS — Z0000 Encounter for general adult medical examination without abnormal findings: Secondary | ICD-10-CM | POA: Diagnosis not present

## 2024-07-28 DIAGNOSIS — I10 Essential (primary) hypertension: Secondary | ICD-10-CM | POA: Diagnosis not present

## 2024-07-28 DIAGNOSIS — Z23 Encounter for immunization: Secondary | ICD-10-CM | POA: Diagnosis not present

## 2024-09-01 DIAGNOSIS — E669 Obesity, unspecified: Secondary | ICD-10-CM | POA: Diagnosis not present

## 2024-09-01 DIAGNOSIS — G4733 Obstructive sleep apnea (adult) (pediatric): Secondary | ICD-10-CM | POA: Diagnosis not present

## 2024-09-01 DIAGNOSIS — G4700 Insomnia, unspecified: Secondary | ICD-10-CM | POA: Diagnosis not present

## 2024-09-01 DIAGNOSIS — Z720 Tobacco use: Secondary | ICD-10-CM | POA: Diagnosis not present

## 2024-09-01 DIAGNOSIS — I10 Essential (primary) hypertension: Secondary | ICD-10-CM | POA: Diagnosis not present

## 2024-10-07 ENCOUNTER — Emergency Department (HOSPITAL_COMMUNITY)

## 2024-10-07 ENCOUNTER — Encounter (HOSPITAL_COMMUNITY): Payer: Self-pay | Admitting: *Deleted

## 2024-10-07 ENCOUNTER — Other Ambulatory Visit: Payer: Self-pay

## 2024-10-07 ENCOUNTER — Ambulatory Visit
Admission: EM | Admit: 2024-10-07 | Discharge: 2024-10-07 | Disposition: A | Discharge status: Home / Self Care | Attending: Physician Assistant | Admitting: Physician Assistant

## 2024-10-07 ENCOUNTER — Observation Stay (HOSPITAL_COMMUNITY)
Admission: EM | Admit: 2024-10-07 | Discharge: 2024-10-08 | Disposition: A | Attending: Hospitalist | Admitting: Hospitalist

## 2024-10-07 ENCOUNTER — Telehealth: Payer: Self-pay

## 2024-10-07 ENCOUNTER — Inpatient Hospital Stay (HOSPITAL_COMMUNITY)

## 2024-10-07 DIAGNOSIS — G459 Transient cerebral ischemic attack, unspecified: Secondary | ICD-10-CM | POA: Diagnosis not present

## 2024-10-07 DIAGNOSIS — Q670 Congenital facial asymmetry: Secondary | ICD-10-CM

## 2024-10-07 DIAGNOSIS — I639 Cerebral infarction, unspecified: Secondary | ICD-10-CM | POA: Diagnosis present

## 2024-10-07 DIAGNOSIS — R9431 Abnormal electrocardiogram [ECG] [EKG]: Secondary | ICD-10-CM | POA: Diagnosis not present

## 2024-10-07 DIAGNOSIS — H547 Unspecified visual loss: Secondary | ICD-10-CM | POA: Diagnosis not present

## 2024-10-07 DIAGNOSIS — R2990 Unspecified symptoms and signs involving the nervous system: Secondary | ICD-10-CM | POA: Diagnosis not present

## 2024-10-07 DIAGNOSIS — R079 Chest pain, unspecified: Secondary | ICD-10-CM | POA: Diagnosis not present

## 2024-10-07 DIAGNOSIS — R29700 NIHSS score 0: Secondary | ICD-10-CM | POA: Diagnosis not present

## 2024-10-07 DIAGNOSIS — I63532 Cerebral infarction due to unspecified occlusion or stenosis of left posterior cerebral artery: Secondary | ICD-10-CM | POA: Diagnosis not present

## 2024-10-07 LAB — COMPREHENSIVE METABOLIC PANEL WITH GFR
ALT: 17 U/L (ref 0–44)
AST: 19 U/L (ref 15–41)
Albumin: 3.9 g/dL (ref 3.5–5.0)
Alkaline Phosphatase: 81 U/L (ref 38–126)
Anion gap: 14 (ref 5–15)
BUN: 23 mg/dL — ABNORMAL HIGH (ref 6–20)
CO2: 26 mmol/L (ref 22–32)
Calcium: 9 mg/dL (ref 8.9–10.3)
Chloride: 96 mmol/L — ABNORMAL LOW (ref 98–111)
Creatinine, Ser: 2.01 mg/dL — ABNORMAL HIGH (ref 0.61–1.24)
GFR, Estimated: 38 mL/min — ABNORMAL LOW (ref >60)
Glucose, Bld: 92 mg/dL (ref 70–99)
Potassium: 3.6 mmol/L (ref 3.5–5.1)
Sodium: 136 mmol/L (ref 135–145)
Total Bilirubin: 1.1 mg/dL (ref 0.0–1.2)
Total Protein: 6.9 g/dL (ref 6.5–8.1)

## 2024-10-07 LAB — PROTIME-INR
INR: 1 (ref 0.8–1.2)
Prothrombin Time: 13.8 s (ref 11.4–15.2)

## 2024-10-07 LAB — CBC
HCT: 47.2 % (ref 39.0–52.0)
Hemoglobin: 15.9 g/dL (ref 13.0–17.0)
MCH: 31.4 pg (ref 26.0–34.0)
MCHC: 33.7 g/dL (ref 30.0–36.0)
MCV: 93.1 fL (ref 80.0–100.0)
Platelets: 293 K/uL (ref 150–400)
RBC: 5.07 MIL/uL (ref 4.22–5.81)
RDW: 13.3 % (ref 11.5–15.5)
WBC: 16.3 K/uL — ABNORMAL HIGH (ref 4.0–10.5)
nRBC: 0 % (ref 0.0–0.2)

## 2024-10-07 LAB — HIV ANTIBODY (ROUTINE TESTING W REFLEX): HIV Screen 4th Generation wRfx: NONREACTIVE

## 2024-10-07 LAB — I-STAT CHEM 8, ED
BUN: 31 mg/dL — ABNORMAL HIGH (ref 6–20)
Calcium, Ion: 1.01 mmol/L — ABNORMAL LOW (ref 1.15–1.40)
Chloride: 94 mmol/L — ABNORMAL LOW (ref 98–111)
Creatinine, Ser: 2 mg/dL — ABNORMAL HIGH (ref 0.61–1.24)
Glucose, Bld: 89 mg/dL (ref 70–99)
HCT: 47 % (ref 39.0–52.0)
Hemoglobin: 16 g/dL (ref 13.0–17.0)
Potassium: 3.5 mmol/L (ref 3.5–5.1)
Sodium: 137 mmol/L (ref 135–145)
TCO2: 31 mmol/L (ref 22–32)

## 2024-10-07 LAB — DIFFERENTIAL
Abs Immature Granulocytes: 0.11 K/uL — ABNORMAL HIGH (ref 0.00–0.07)
Basophils Absolute: 0.2 K/uL — ABNORMAL HIGH (ref 0.0–0.1)
Basophils Relative: 1 %
Eosinophils Absolute: 0.3 K/uL (ref 0.0–0.5)
Eosinophils Relative: 2 %
Immature Granulocytes: 1 %
Lymphocytes Relative: 16 %
Lymphs Abs: 2.6 K/uL (ref 0.7–4.0)
Monocytes Absolute: 1.7 K/uL — ABNORMAL HIGH (ref 0.1–1.0)
Monocytes Relative: 11 %
Neutro Abs: 11.3 K/uL — ABNORMAL HIGH (ref 1.7–7.7)
Neutrophils Relative %: 69 %

## 2024-10-07 LAB — RAPID URINE DRUG SCREEN, HOSP PERFORMED
Amphetamines: NOT DETECTED
Barbiturates: NOT DETECTED
Benzodiazepines: POSITIVE — AB
Cocaine: NOT DETECTED
Opiates: NOT DETECTED
Tetrahydrocannabinol: POSITIVE — AB

## 2024-10-07 LAB — CBG MONITORING, ED: Glucose-Capillary: 103 mg/dL — ABNORMAL HIGH (ref 70–99)

## 2024-10-07 LAB — ETHANOL: Alcohol, Ethyl (B): <15 mg/dL (ref <15)

## 2024-10-07 LAB — APTT: aPTT: 29 s (ref 24–36)

## 2024-10-07 MED ORDER — IOHEXOL 350 MG/ML SOLN
75.0000 mL | Freq: Once | INTRAVENOUS | Status: AC | PRN
  Administered 2024-10-07: 75 mL via INTRAVENOUS

## 2024-10-07 MED ORDER — SODIUM CHLORIDE 0.9 % IV BOLUS
1000.0000 mL | Freq: Once | INTRAVENOUS | Status: AC
  Administered 2024-10-07: 1000 mL via INTRAVENOUS

## 2024-10-07 MED ORDER — ATORVASTATIN CALCIUM 40 MG PO TABS
40.0000 mg | ORAL_TABLET | Freq: Every day | ORAL | Status: DC
  Administered 2024-10-07 – 2024-10-08 (×2): 40 mg via ORAL
  Filled 2024-10-07 (×2): qty 1

## 2024-10-07 MED ORDER — CLOPIDOGREL BISULFATE 300 MG PO TABS
300.0000 mg | ORAL_TABLET | Freq: Once | ORAL | Status: AC
  Administered 2024-10-07: 300 mg via ORAL
  Filled 2024-10-07: qty 1

## 2024-10-07 MED ORDER — ASPIRIN 325 MG PO TABS
650.0000 mg | ORAL_TABLET | Freq: Once | ORAL | Status: AC
  Administered 2024-10-07: 650 mg via ORAL
  Filled 2024-10-07: qty 2

## 2024-10-07 MED ORDER — STROKE: EARLY STAGES OF RECOVERY BOOK
Freq: Once | Status: AC
  Filled 2024-10-07: qty 1

## 2024-10-07 NOTE — ED Triage Notes (Signed)
 PA saw pt in triage and called code stroke,  pt taken immediately to CT 3 where primary RN met me.  Phlebotomy was unable to get blood in triage.  Pt tells me that at 10am he had a visual loss in right eye which has resolved.  Quick triage RN noted additional symptoms.  Weight before CT 100.2kg

## 2024-10-07 NOTE — ED Provider Notes (Signed)
 [facility]    CSN: [id number] Arrival date & time: 10/07/24  1151      History   Chief Complaint Chief Complaint  Patient presents with   Chest Pain   Aphasia    HPI Matthew Valencia is a 58 y.o. male.  has a past medical history of Dyslipidemia, Hypertension, Lumbar disc disease, Obesity, and Obstructive sleep apnea.   HPI  Pt is here with his son and joined shortly after by his wife.   Patient and son are providing details of HPI He states that while he was at work he started to have vision loss in his right eye and was told that his eye looked sluggish by his production designer, theatre/television/film.  He also reports that he was running into things at work due to vision loss and difficulty maneuvering.  He states that he feels weak overall but denies obvious unilateral weakness.  He reports that he is having a 3/10 headache along the back of the head and extending to the left temple.  He also reports that he is having middle to right sided chest pain has been ongoing for several months but seems to be getting worse today.  Patient's son states that he seems slightly confused and is slurring his words which does not seem like his normal speech patterns.  His son states that he is also concern for right sided facial asymmetry.  Past Medical History:  Diagnosis Date   Dyslipidemia    Hypertension    Lumbar disc disease    Obesity    Obstructive sleep apnea    Does not use CPAP secondary to cost    Patient Active Problem List   Diagnosis Date Noted   CVA (cerebral vascular accident) (HCC) 10/07/2024   Essential hypertension 04/17/2021   Dyslipidemia 04/17/2021   SOB (shortness of breath) 02/19/2021    Past Surgical History:  Procedure Laterality Date   HEMORROIDECTOMY         Home Medications    Prior to Admission medications   Medication Sig Start Date End Date Taking? Authorizing Provider  losartan-hydrochlorothiazide (HYZAAR) 50-12.5 MG tablet Take 1 tablet by mouth daily.  10/06/24  Yes [provider]  albuterol (VENTOLIN HFA) 108 (90 Base) MCG/ACT inhaler Inhale 1 puff into the lungs every 6 (six) hours as needed for shortness of breath. As Needed 02/16/21   [provider]  atorvastatin  (LIPITOR) 40 MG tablet Take 1 tablet (40 mg total) by mouth daily. 04/18/21 10/07/24  Lavona Agent, MD  furosemide (LASIX) 20 MG tablet Take 20 mg by mouth daily. 02/16/21   [provider]  meloxicam (MOBIC) 15 MG tablet Take 15 mg by mouth daily. 08/22/24   [provider]    Family History Family History  Problem Relation Age of Onset   Diabetes Mother    Lung cancer Father    Coronary artery disease Father 1   Heart failure Other    Coronary artery disease Other     Social History Social History   Tobacco Use   Smoking status: Every Day    Current packs/day: 0.50    Average packs/day: 0.5 packs/day for 20.0 years (10.0 ttl pk-yrs)    Types: Cigarettes   Smokeless tobacco: Never  Vaping Use   Vaping status: Never Used  Substance Use Topics   Alcohol use: Yes    Comment: Occasionally   Drug use: Never     Allergies   Trazodone hcl   Review of Systems Review of  Systems  Eyes:  Positive for visual disturbance.  Respiratory:  Negative for shortness of breath.   Cardiovascular:  Positive for chest pain.  Neurological:  Positive for facial asymmetry, speech difficulty and headaches.  Psychiatric/Behavioral:  Positive for confusion.      Physical Exam Triage Vital Signs ED Triage Vitals  Encounter Vitals Group     BP 10/07/24 1203 97/65     Girls Systolic BP Percentile --      Girls Diastolic BP Percentile --      Boys Systolic BP Percentile --      Boys Diastolic BP Percentile --      Pulse Rate 10/07/24 1203 70     Resp 10/07/24 1203 18     Temp --      Temp src --      SpO2 10/07/24 1204 96 %     Weight --      Height --      Head Circumference --      Peak Flow --      Pain Score --      Pain Loc  --      Pain Education --      Exclude from Growth Chart --    No data found.  Updated Vital Signs BP 99/66 (BP Location: Left Arm)   Pulse 70   Resp 18   SpO2 96%   Visual Acuity Right Eye Distance:   Left Eye Distance:   Bilateral Distance:    Right Eye Near:   Left Eye Near:    Bilateral Near:     Physical Exam Vitals reviewed.  Constitutional:      General: He is awake. He is not in acute distress.    Appearance: Normal appearance. He is well-developed and well-groomed. He is not ill-appearing or diaphoretic.  Eyes:     General: Lids are normal. Gaze aligned appropriately.     Extraocular Movements: Extraocular movements intact.     Conjunctiva/sclera: Conjunctivae normal.     Pupils: Pupils are equal.     Right eye: Pupil is sluggish.     Left eye: Pupil is round, reactive and not sluggish.     Comments: Right pupil is responsive but sluggish compared to the left.  Pulmonary:     Effort: Pulmonary effort is normal.  Musculoskeletal:     Cervical back: Normal range of motion and neck supple.  Skin:    General: Skin is warm.  Neurological:     Mental Status: He is alert.     GCS: GCS eye subscore is 4. GCS verbal subscore is 5. GCS motor subscore is 6.     Cranial Nerves: Dysarthria and facial asymmetry present.     Sensory: No sensory deficit.     Motor: Weakness present. No tremor.     Gait: Gait is intact.     Comments: Patient has 4/5 strength with right hip flexion compared to 5/5 left hip flexion strength Dorsiflexion and plantarflexion are both 5/5 bilaterally Grip strength is 4/5 bilaterally  No obvious evidence of tremor  Patient has what appears to be a right sided deficit with smiling.  There appears to be right sided facial droop.  Midline tongue protrusion. Patient reports sensation is equal to light touch to both sides of the face and arms. Patient is able to puff cheeks but smiling appears to show right sided deficit along the mouth and patient  is intermittently slurring some words  Psychiatric:  Behavior: Behavior is cooperative.      UC Treatments / Results  Labs (all labs ordered are listed, but only abnormal results are displayed) Labs Reviewed - No data to display  EKG   Radiology  Procedures ED EKG  Date/Time: 10/07/2024 3:51 PM  Performed by: Marylene Rocky BRAVO, PA-C Authorized by: Marylene Rocky BRAVO, PA-C   Previous ECG:    Previous ECG:  Compared to current   Similarity:  Changes noted   Comparison ECG info:  02/21/21 Interpretation:    Interpretation: non-specific   Rate:    ECG rate:  59   ECG rate assessment: normal   Rhythm:    Rhythm: sinus rhythm and sinus bradycardia   QRS:    QRS conduction: RBBB   ST segments:    ST segments:  Non-specific T waves:    T waves: non-specific    (including critical care time)  Medications Ordered in UC Medications - No data to display  Initial Impression / Assessment and Plan / UC Course  I have reviewed the triage vital signs and the nursing notes.  Pertinent labs & imaging results that were available during my care of the patient were reviewed by me and considered in my medical decision making (see chart for details).      Final Clinical Impressions(s) / UC Diagnoses   Final diagnoses:  Facial asymmetry  Chest pain, unspecified type  Stroke-like symptoms   Patient presents to urgent care today with his son and was joined shortly by his wife.  While patient was at work he started to have unilateral vision changes including right sided vision loss causing him to bump into things and needing to take a break.  His son reports that he appears to be slurring his words and he is concerned for potential right sided facial drooping.  The patient was advised by his manager to come to urgent care or the ER as they were concerned for potential stroke given his symptoms.  Patient states that he has a 3/10 headache that is significantly uncomfortable.  He also  reports that he has pressure-like sensation in the chest that does not feel like his typical gas pain and has been ongoing for about a month but seemed to get worse over the last 3 days.  Neurological exam is concerning for sluggish right pupil, right sided facial drooping with slight slurring of words.  I am also concerned for mildly reduced strength on the right side with regards to grip strength and hip flexion strength compared to the left. EKG show non-specific changes in  V4 and V3 as well as evidence of RBBB.    I discussed my concerns for potential stroke as well as potential STEMI/NSTEMI with the patient and his family members.  I reviewed the importance of getting to the emergency room safely via EMS but patient declined several times and states that he will go via private vehicle.  His family states that they will take him immediately to [facility] ER for evaluation and management.  I reviewed with family that if for any reason they faced complications and  are unable to make it to the ER they should call 911 for assistance.They all voiced agreement and understanding and left for the ER via private vehicle. The charge nurse at [facility] was contacted to inform them that the patient was incoming via private vehicle as well as concerns for high acuity by [place]  Orie, RN   Discharge Instructions   None  ED Prescriptions   None    PDMP not reviewed this encounter.   Marylene Rocky BRAVO, PA-C 10/07/24 1558

## 2024-10-07 NOTE — Code Documentation (Signed)
 Stroke Response Nurse Documentation Code Documentation  Matthew Valencia is a 58 y.o. male arriving to [place]  via Private Vehicle on 12/9 with past medical hx of HTN, OSA, DM, HLD. On No antithrombotic. Code stroke was activated by ED.   Patient from urgent care where he was evaluated for R eye vision loss lasting approximately 30 mins, beginning at 1000. LKW at 1000 and now complaining of vision loss which has resolved, as well as transient chest pain, slurred speech, and facial droop reported by family.   Stroke team at the bedside on patient arrival. Labs drawn and patient cleared for CT by Dr. Elnor. Patient to CT with team. NIHSS 0, see documentation for details and code stroke times. The following imaging was completed:  CT Head and CTA. Patient is not a candidate for IV Thrombolytic due to symptom resolution. Patient is not a candidate for IR due to symptom resolution.   Care Plan: q2 NIHSS and vitals. Re-activate code stroke should symptoms return. Swallow screen prior to PO intake.  BP goal <220/110  Bedside handoff with ED RN Chiquita.    Lauraine LITTIE Searle  Stroke Response RN

## 2024-10-07 NOTE — ED Provider Notes (Signed)
 [place] EMERGENCY DEPARTMENT AT [facility] Provider Note   CSN: [id number] Arrival date & time: 10/07/24  1243  An emergency department physician performed an initial assessment on this suspected stroke patient at 1300.  Patient presents with: Loss of Vision   Matthew Valencia is a 58 y.o. male.   58 year old male with a past medical history of hypertension, high cholesterol, tobacco use presents to the ED via POV with a chief complaint of loss of vision.  Patient reports he was at work approximately at 10 AM looking at his phone about to take a picture when suddenly he lost complete vision of his right eye he reports this episode was accompanied by sharp stabbing substernal pain.  He also felt like he developed a headache then, but does not have any underlying history of migraines.  He tells me that the episode lasted approximately 30 minutes.  He states his boss told him that his right pupil was dilated.  He did not take any medication for improvement in symptoms.  Vision later resolved.  He does have a family history of CAD states my family likes to die from heart attacks early .  No recent illness, no recent long distance travel, no sedentary jobs, no fever, no other complaints reported.  The history is provided by the patient and medical records.       Prior to Admission medications   Medication Sig Start Date End Date Taking? Authorizing Provider  albuterol (VENTOLIN HFA) 108 (90 Base) MCG/ACT inhaler As Needed 02/16/21   [provider]  atorvastatin  (LIPITOR) 40 MG tablet Take 1 tablet (40 mg total) by mouth daily. 04/18/21 07/17/21  Lavona Agent, MD  furosemide (LASIX) 20 MG tablet Take 20 mg by mouth daily. 02/16/21   [provider]  losartan-hydrochlorothiazide (HYZAAR) 50-12.5 MG tablet Take 1 tablet by mouth daily. 10/06/24   [provider]  meloxicam (MOBIC) 15 MG tablet Take 15 mg by mouth daily. 08/22/24   [provider]     Allergies: Other and Trazodone hcl    Review of Systems  Constitutional:  Negative for fever.  Eyes:  Positive for visual disturbance.  Respiratory:  Negative for shortness of breath.   Cardiovascular:  Positive for chest pain.  Gastrointestinal:  Negative for abdominal pain.  Neurological:  Positive for headaches.    Updated Vital Signs BP 114/64   Pulse (!) 57   Temp 97.9 F (36.6 C) (Oral)   Resp 15   Ht 6' (1.829 m)   Wt 100.2 kg   SpO2 100%   BMI 29.96 kg/m   Physical Exam Vitals and nursing note reviewed.  Constitutional:      Appearance: Normal appearance.  HENT:     Head: Normocephalic and atraumatic.     Mouth/Throat:     Mouth: Mucous membranes are moist.  Eyes:     Pupils: Pupils are equal, round, and reactive to light.  Cardiovascular:     Rate and Rhythm: Normal rate.  Pulmonary:     Effort: Pulmonary effort is normal.  Abdominal:     General: Abdomen is flat.  Musculoskeletal:     Cervical back: Normal range of motion and neck supple.  Skin:    General: Skin is warm and dry.  Neurological:     Mental Status: He is alert and oriented to person, place, and time.     (all labs ordered are listed, but only abnormal results are displayed) Labs Reviewed  CBC - Abnormal;  Notable for the following components:      Result Value   WBC 16.3 (*)    All other components within normal limits  DIFFERENTIAL - Abnormal; Notable for the following components:   Neutro Abs 11.3 (*)    Monocytes Absolute 1.7 (*)    Basophils Absolute 0.2 (*)    Abs Immature Granulocytes 0.11 (*)    All other components within normal limits  COMPREHENSIVE METABOLIC PANEL WITH GFR - Abnormal; Notable for the following components:   Chloride 96 (*)    BUN 23 (*)    Creatinine, Ser 2.01 (*)    GFR, Estimated 38 (*)    All other components within normal limits  I-STAT CHEM 8, ED - Abnormal; Notable for the following components:   Chloride 94 (*)    BUN 31 (*)     Creatinine, Ser 2.00 (*)    Calcium , Ion 1.01 (*)    All other components within normal limits  CBG MONITORING, ED - Abnormal; Notable for the following components:   Glucose-Capillary 103 (*)    All other components within normal limits  PROTIME-INR  APTT  ETHANOL  RAPID URINE DRUG SCREEN, HOSP PERFORMED  HEMOGLOBIN A1C    EKG: None  Radiology: CT HEAD CODE STROKE WO CONTRAST (LKW 0-4.5h, LVO 0-24h) Result Date: 10/07/2024 EXAM: CT HEAD WITHOUT 10/07/2024 01:14:04 PM TECHNIQUE: CT of the head was performed without the administration of intravenous contrast. Automated exposure control, iterative reconstruction, and/or weight based adjustment of the mA/kV was utilized to reduce the radiation dose to as low as reasonably achievable. COMPARISON: None available. CLINICAL HISTORY: Neuro deficit, acute, stroke suspected. FINDINGS: BRAIN AND VENTRICLES: There is subcentimeter focus of hypoattenuation in the region of the genu of the corpus callosum (series 3, image 18). No acute intracranial hemorrhage. No mass effect or midline shift. No extra-axial fluid collection. No evidence of acute infarct. No hydrocephalus. ORBITS: No acute abnormality. SINUSES AND MASTOIDS: Leftward nasal septal deviation. No acute abnormality. SOFT TISSUES AND SKULL: No acute skull fracture. No acute soft tissue abnormality. IMPRESSION: 1. No acute intracranial hemorrhage or acute territorial infarction. 2. The above finding was communicated via the Amion text paging service to Dr. Voncile. 3. Nonspecific subcentimeter focus of hypoattenuation in the region of the genu of the corpus callosum may represent chronic infarct, inflammatory/demyelinating disease, or metabolic etiology. MRI brain would be useful to further assess. Electronically signed by: prentice spade 10/07/2024 02:01 PM EST RP Workstation: [id number]   CT ANGIO HEAD NECK W WO CM (CODE STROKE) Result Date: 10/07/2024 CLINICAL DATA:  Headache, loss of vision EXAM:  CT ANGIOGRAPHY HEAD AND NECK WITH AND WITHOUT CONTRAST TECHNIQUE: Multidetector CT imaging of the head and neck was performed using the standard protocol during bolus administration of intravenous contrast. Multiplanar CT image reconstructions and MIPs were obtained to evaluate the vascular anatomy. Carotid stenosis measurements (when applicable) are obtained utilizing NASCET criteria, using the distal internal carotid diameter as the denominator. RADIATION DOSE REDUCTION: This exam was performed according to the departmental dose-optimization program which includes automated exposure control, adjustment of the mA and/or kV according to patient size and/or use of iterative reconstruction technique. CONTRAST:  75mL OMNIPAQUE  IOHEXOL  350 MG/ML SOLN COMPARISON:  None Available. CTA NECK: CTA NECK Aortic arch: No proximal vessel stenosis. Right carotid: Normal Left carotid: Normal Right vertebral: Normal Left vertebral: Normal Soft tissues: No significant abnormality Other comments: None CTA HEAD: CTA HEAD Right anterior circulation: The internal carotid artery is patent without significant  stenosis. The anterior and middle cerebral arteries are patent without significant stenosis or proximal branch occlusion. No aneurysm. Left anterior circulation: The internal carotid artery is patent without significant stenosis. The anterior and middle cerebral arteries are patent without significant stenosis or proximal branch occlusion. No aneurysm. Posterior circulation: Both vertebral arteries are patent. The right is dominant. There is a occlusion of the left posterior cerebral artery. IMPRESSION: Left posterior cerebral artery occlusion No carotid artery stenosis on either side Electronically Signed   By: Nancyann Burns M.D.   On: 10/07/2024 13:47     Procedures   Medications Ordered in the ED   stroke: early stages of recovery book (has no administration in time range)  atorvastatin  (LIPITOR) tablet 40 mg (has no  administration in time range)  iohexol  (OMNIPAQUE ) 350 MG/ML injection 75 mL (75 mLs Intravenous Contrast Given 10/07/24 1337)  sodium chloride  0.9 % bolus 1,000 mL (1,000 mLs Intravenous New Bag/Given 10/07/24 1341)  aspirin  tablet 650 mg (650 mg Oral Given 10/07/24 1401)  clopidogrel  (PLAVIX ) tablet 300 mg (300 mg Oral Given 10/07/24 1401)    Clinical Course as of 10/07/24 1502  Tue Oct 07, 2024  1338 WBC(!): 16.3 [JS]  1339 Creatinine(!): 2.00 [JS]    Clinical Course User Index [JS] Johnni Wunschel, PA-C                                 Medical Decision Making Amount and/or Complexity of Data Reviewed Labs: ordered. Decision-making details documented in ED Course.   This patient presents to the ED for concern of vision loss, this involves a number of treatment options, and is a complaint that carries with it a high risk of complications and morbidity.  The differential diagnosis includes stroke,    Co morbidities: Discussed in HPI   Brief History:  See HPI.   EMR reviewed including pt PMHx, past surgical history and past visits to ER.   See HPI for more details   Lab Tests:  I ordered and independently interpreted labs.  The pertinent results include:    CBC with no leukocytes CMP with no electrolyte derangement  Levels elevated with a creatinine of 2, elevated from his baseline with a mild AKI.  CBG is 103.  Ethanol level is negative.  PT and INR normal.  Imaging Studies:  CT angio head and neck showed: IMPRESSION:  1. No acute intracranial hemorrhage or acute territorial infarction.  2. The above finding was communicated via the Amion text paging service to Dr.  Voncile.  3. Nonspecific subcentimeter focus of hypoattenuation in the region of the genu  of the corpus callosum may represent chronic infarct,  inflammatory/demyelinating disease, or metabolic etiology. MRI brain would be  useful to further assess.   Cardiac Monitoring:  The patient was maintained on a  cardiac monitor.  I personally viewed and interpreted the cardiac monitored which showed an underlying rhythm of: NSR EKG non-ischemic  Medicines ordered:  N/A  Consults:  I requested consultation with Dr. Deedra,  and discussed lab and imaging findings as well as pertinent plan - they recommend: admission for further workup.   Reevaluation:  After the interventions noted above I re-evaluated patient and found that they have :improved  Social Determinants of Health:  The patient's social determinants of health were a factor in the care of this patient  Problem List / ED Course:  Patient presented to the ED with sudden  onset of right eye vision loss which occurred while he was at work around 10 AM.  Reports looking at his phone when suddenly his right eye loss of vision.  He reports this episode lasted for approximately 30 minutes.  He reports he did have some chest pain when this episode occurred.  He is also endorsing a headache but does not have any underlying history of migraines.  Blood work here is remarkable for a white blood cell count of 16,000.  Creatinine remarkable for an AKI.  He was made a code stroke after he went to urgent care and came to the emergency department for further evaluation. On evaluation he tells me that his symptoms have now resolved he does not have any upper or lower extremity weaknesses.  He does not have any dysarthria or facial asymmetry.  His vitals are within normal limits at this time.  CT angio head and neck do show a small left posterior cerebral artery occlusion, he was evaluated by Dr. Deedra.  His NIH score is 0 at the time of my evaluation. Patient is asymptomatic at this time, but does meet criteria for admission for further stroke workup. I discussed this with wife and patient at the bedside who are agreeable of staying at this time. I spoke to Dr. Georgina hospitalist service who will admit patient for further management.  Dispostion:  After  consideration of the diagnostic results and the patients response to treatment, I feel that the patent would benefit from admission for further evaluation.    Portions of this note were generated with Scientist, clinical (histocompatibility and immunogenetics). Dictation errors may occur despite best attempts at proofreading.   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

## 2024-10-07 NOTE — ED Triage Notes (Addendum)
 Pt sent from UC due to stroke like symptoms. Family reports that at 1000 today pt had right sided weakness, facial droop and slurred speech. Slurred speech and facial droop not noted on arrival. ED Charge nurse notified.

## 2024-10-07 NOTE — ED Notes (Signed)
 Patient is being discharged from the Urgent Care and sent to the Emergency Department via POV with family. Per Erin Mecum PA-C, patient is in need of higher level of care due to right sided deficits, chest pain, slurring of speech, facial dropping, and an abnormal EKG. Patient is aware and verbalizes understanding of plan of care. Patient refused EMS multiple times and was instructed to head straight to the ED and call 911 on the way if there were any issues.  Vitals:   10/07/24 1203 10/07/24 1204  BP: 97/65 99/66  Pulse: 70   Resp: 18   SpO2:  96%

## 2024-10-07 NOTE — ED Notes (Signed)
 Patient unable to urinate

## 2024-10-07 NOTE — Consult Note (Signed)
 NEUROLOGY CONSULT NOTE   Date of service: October 07, 2024 Patient Name: Matthew Valencia MRN:  [id number] DOB:  05/19/1966 Chief Complaint: Code stroke-right eye vision loss Requesting Provider: Garrick Charleston, MD, Orvil Overland PA-C  History of Present Illness  Matthew Valencia is a 58 y.o. male with hx of diabetes, hypertension, hyperlipidemia, tobacco abuse, sleep apnea, presented to the emergency department for evaluation of transient right eye vision loss.  Upon triage in the ED, a code stroke was activated with the thought process that he likely still is having symptoms but upon getting to the CT scanner he reported symptoms had resolved. 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  All of a sudden, while trying to look at his phone, he could not see out of his right eye.  He reports a curtain like darkness falling over the right eye.  The symptoms lasted about 30 minutes.  He also had chest pain during this time.  The visual symptoms had completely resolved.  The chest pain also resolved in about the same duration of time. Denies any tingling numbness or weakness anywhere in the body at this time.  LKW: 10 AM Modified rankin score: 0-Completely asymptomatic and back to baseline post- stroke IV Thrombolysis: No-mild to treat-symptoms resolved EVT: No-too mild to treat/symptoms resolved NIH stroke scale 0     ROS  Comprehensive ROS performed and pertinent positives noted in the HPI.  Past History   Past Medical History:  Diagnosis Date   Dyslipidemia    Hypertension    Lumbar disc disease    Obesity    Obstructive sleep apnea    Does not use CPAP secondary to cost    Past Surgical History:  Procedure Laterality Date   HEMORROIDECTOMY      Family History: Family History  Problem Relation Age of Onset   Diabetes Mother    Lung cancer Father    Coronary artery disease Father 42   Heart failure Other     Coronary artery disease Other     Social History  reports that he has been smoking cigarettes. He has a 10 pack-year smoking history. He has never used smokeless tobacco. He reports current alcohol use. He reports that he does not use drugs.  Allergies  Allergen Reactions   Other     Other reaction(s): nausea Other reaction(s): mild mylagia   Trazodone Hcl     Other reaction(s): lip swelling    Medications  No current facility-administered medications for this encounter.  Current Outpatient Medications:    albuterol (VENTOLIN HFA) 108 (90 Base) MCG/ACT inhaler, As Needed, Disp: , Rfl:    ALPRAZolam (XANAX) 0.5 MG tablet, Take 0.25-0.5 mg by mouth 2 (two) times daily as needed (As Needed)., Disp: , Rfl:    atorvastatin  (LIPITOR) 40 MG tablet, Take 1 tablet (40 mg total) by mouth daily., Disp: 90 tablet, Rfl: 3   escitalopram (LEXAPRO) 10 MG tablet, Take 1 tablet by mouth daily., Disp: , Rfl:    furosemide (LASIX) 20 MG tablet, Take 20 mg by mouth daily., Disp: , Rfl:    losartan-hydrochlorothiazide (HYZAAR) 50-12.5 MG tablet, Take 1 tablet by mouth daily., Disp: , Rfl:   Vitals   Vitals:   10/07/24 1248 10/07/24 1312  BP: 93/61   Pulse: 71   Resp: 18   Temp: 97.9 F (36.6 C)   TempSrc: Oral   SpO2: 96%   Weight:  100.2 kg  Body mass index is 29.96 kg/m.   Physical Exam   GENERAL: Awake, alert in NAD HEENT: - Normocephalic and atraumatic, dry mm, no LN++, no Thyromegally LUNGS - Clear to auscultation bilaterally with no wheezes CV - S1S2 RRR, no m/r/g, equal pulses bilaterally. ABDOMEN - Soft, nontender, nondistended with normoactive BS NEURO:  Mental Status: AA&Ox3 Speech and Language: speech is clear and nondysarthric.  Naming, repetition, fluency, and comprehension intact. Cranial Nerves: PERRL. EOMI, visual fields full, no facial asymmetry, facial sensation intact, hearing intact, tongue/uvula/soft palate midline, normal sternocleidomastoid and trapezius  muscle strength. No evidence of tongue atrophy or fibrillations Motor: 5/5 without drift in all 4 extremities Tone: is normal and bulk is normal Sensation- Intact to light touch bilaterally Coordination: FTN intact bilaterally, no ataxia in BLE. Gait- deferred   Labs/Imaging/Neurodiagnostic studies   CBC:  Recent Labs  Lab 2024-10-15 1327  HGB 16.0  HCT 47.0   Basic Metabolic Panel:  Lab Results  Component Value Date   NA 137 2024/10/15   K 3.5 2024-10-15   CO2 27 11/19/2010   GLUCOSE 89 2024/10/15   BUN 31 (H) 2024-10-15   CREATININE 2.00 (H) 10/15/2024   CALCIUM  9.8 11/19/2010   GFRNONAA >60 11/19/2010   GFRAA  11/19/2010    >60        The eGFR has been calculated using the MDRD equation. This calculation has not been validated in all clinical situations. eGFR's persistently <60 mL/min signify possible Chronic Kidney Disease.   CT Head without contrast(Personally reviewed): No infarct.  No bleed.  Aspects 10.  CT angio Head and Neck with contrast(Personally reviewed): No ELVO.  Minimal calcification around carotid bifurcations.  There is a left P2 occlusion seen on the CTA   ASSESSMENT   Matthew Valencia is a 58 y.o. male with a 30-minute episode of right eye vision loss, along with chest discomfort-all symptoms resolved.  History of diabetes, hypertension, hyperlipidemia and tobacco abuse along with sleep apnea. Patient was insistent that he lost vision in his right eye but looking at the left P2 occlusion, may have had right visual field deficit and might of collateralized and that is why I cannot pick up that feel deficit on my examination.  That said, he is at high risk for having strokes and TIAs and should come in for further evaluation.  Impression:  Stroke/TIA in the left posterior cerebral artery territory-etiology under investigation   RECOMMENDATIONS  Admit to hospitalist Frequent neurochecks Telemetry Activate a code stroke if he has visual field  deficits again Load with aspirin  650 and Plavix  300 x 1. Start aspirin  81+ Plavix  75 from tomorrow. High intensity statin for goal LDL less than 70 Check A1c and lipid panel MRI brain without contrast 2D echo Therapy assessments Permissive hypertension-treat only if systolic blood pressures greater than 220 on a as needed basis.  Discussed the imaging findings with neurointerventional radiology-given an NIH stroke scale of 0, not a candidate for intervention.   Plan discussed with Orvil Overland, PA-C, EDP and Dr. Garrick via secure chat.  Stroke team to follow  ______________________________________________________________________    Signed, Eligio Lav, MD [facility]

## 2024-10-07 NOTE — ED Triage Notes (Addendum)
 Patient presents with son and later joined by his wife. He has been running into things at work today and his boss wanted him to be seen. From the assessment and the patient and son's history he has had had right sided blurry vision, right sided deficits, slurred speech, confusion, headache, and right sided facial dropping. He has also had chest discomfort/pressure for a few months. He states he has tried Tylenol and Ibuprofen for the headache.

## 2024-10-07 NOTE — ED Notes (Signed)
 Neuro MD at bedside

## 2024-10-07 NOTE — ED Provider Triage Note (Signed)
 Emergency Medicine Provider Triage Evaluation Note  Rocio Wolak , a 58 y.o. male  was evaluated in triage.  Pt complains of sudden onset vision loss in the right eye with headache. Sxs are persistent Code stroke initiated  Review of Systems  Positive: Sudden onset vision loss right eye Negative: Other deficits  Physical Exam  BP 93/61 (BP Location: Right Arm)   Pulse 71   Temp 97.9 F (36.6 C) (Oral)   Resp 18   SpO2 96%  Gen:   Awake, no distress  Was a little bit surprised he made it through the front door without Resp:  Normal  MSK:   Moves extremities without difficulty  Other:    Medical Decision Making  Medically screening exam initiated at 12:59 PM.  Appropriate orders placed.  Toby Breithaupt was informed that the remainder of the evaluation will be completed by another provider, this initial triage assessment does not replace that evaluation, and the importance of remaining in the ED until their evaluation is complete.  ***

## 2024-10-07 NOTE — H&P (Signed)
 History and Physical    Patient: Matthew Valencia FMW:[id number] DOB: 1965-12-05 DOA: 10/07/2024 DOS: the patient was seen and examined on 10/07/2024 PCP: Ransom Other, MD  Patient coming from: Home  Chief Complaint:  Chief Complaint  Patient presents with   Loss of Vision   HPI: Matthew Valencia is a 58 y.o. 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  The patient reported experiencing issues with vision while at work, specifically being unable to see anything on the right side. The episode was described as a complete loss of vision on one side, different from previous instances of blurry vision. The patient reported that this was the first occurrence of such a complete vision loss. This episode lasted for about thirty minutes. The patient mentioned that the vision returned before his evaluation at Urgent Care, where pt was evaluated prior to presenting to the [facility] ED.   In the ED, pt AFVSS. Labs notable for Cr 2 (baseline , and WBC 16.3. CTA head/neck showed 'left posterior cerebral artery occlusion. EDP consulted Neurology who requested admission for TIA/CVA eval.   Review of Systems: As mentioned in the history of present illness. All other systems reviewed and are negative. Past Medical History:  Diagnosis Date   Dyslipidemia    Hypertension    Lumbar disc disease    Obesity    Obstructive sleep apnea    Does not use CPAP secondary to cost   Past Surgical History:  Procedure Laterality Date   HEMORROIDECTOMY     Social History:  reports that he has been smoking cigarettes. He has a 10 pack-year smoking history. He has never used smokeless tobacco. He reports current alcohol use. He reports that he does not use drugs.  Allergies  Allergen Reactions   Other     Other reaction(s): nausea Other reaction(s): mild mylagia   Trazodone Hcl     Other  reaction(s): lip swelling    Family History  Problem Relation Age of Onset   Diabetes Mother    Lung cancer Father    Coronary artery disease Father 35   Heart failure Other    Coronary artery disease Other     Prior to Admission medications   Medication Sig Start Date End Date Taking? Authorizing Provider  albuterol (VENTOLIN HFA) 108 (90 Base) MCG/ACT inhaler As Needed 02/16/21   [provider]  ALPRAZolam (XANAX) 0.5 MG tablet Take 0.25-0.5 mg by mouth 2 (two) times daily as needed (As Needed). 01/21/21   [provider]  atorvastatin  (LIPITOR) 40 MG tablet Take 1 tablet (40 mg total) by mouth daily. 04/18/21 07/17/21  Lavona Agent, MD  furosemide (LASIX) 20 MG tablet Take 20 mg by mouth daily. 02/16/21   [provider]  losartan-hydrochlorothiazide (HYZAAR) 50-12.5 MG tablet Take 1 tablet by mouth daily. 10/06/24   [provider]  meloxicam (MOBIC) 15 MG tablet Take 15 mg by mouth daily. 08/22/24   [provider]    Physical Exam: Vitals:   10/07/24 1248 10/07/24 1312 10/07/24 1338 10/07/24 1400  BP: 93/61   114/64  Pulse: 71   (!) 57  Resp: 18   15  Temp: 97.9 F (36.6 C)     TempSrc: Oral     SpO2: 96%   100%  Weight:  100.2 kg 100.2 kg   Height:   6' (1.829 m)  General: Alert, oriented x3, resting comfortably in no acute distress Respiratory: Lungs clear to auscultation bilaterally with normal respiratory effort; no w/r/r Cardiovascular: Regular rate and rhythm w/o m/r/g   Data Reviewed:  Lab Results  Component Value Date   WBC 16.3 (H) 10/07/2024   HGB 15.9 10/07/2024   HCT 47.2 10/07/2024   MCV 93.1 10/07/2024   PLT 293 10/07/2024   Lab Results  Component Value Date   GLUCOSE 92 10/07/2024   CALCIUM  9.0 10/07/2024   NA 136 10/07/2024   K 3.6 10/07/2024   CO2 26 10/07/2024   CL 96 (L) 10/07/2024   BUN 23 (H) 10/07/2024   CREATININE 2.01 (H) 10/07/2024   Lab Results  Component Value Date   ALT 17  10/07/2024   AST 19 10/07/2024   ALKPHOS 81 10/07/2024   BILITOT 1.1 10/07/2024   Lab Results  Component Value Date   INR 1.0 10/07/2024   Radiology: CT HEAD CODE STROKE WO CONTRAST (LKW 0-4.5h, LVO 0-24h) Result Date: 10/07/2024 EXAM: CT HEAD WITHOUT 10/07/2024 01:14:04 PM TECHNIQUE: CT of the head was performed without the administration of intravenous contrast. Automated exposure control, iterative reconstruction, and/or weight based adjustment of the mA/kV was utilized to reduce the radiation dose to as low as reasonably achievable. COMPARISON: None available. CLINICAL HISTORY: Neuro deficit, acute, stroke suspected. FINDINGS: BRAIN AND VENTRICLES: There is subcentimeter focus of hypoattenuation in the region of the genu of the corpus callosum (series 3, image 18). No acute intracranial hemorrhage. No mass effect or midline shift. No extra-axial fluid collection. No evidence of acute infarct. No hydrocephalus. ORBITS: No acute abnormality. SINUSES AND MASTOIDS: Leftward nasal septal deviation. No acute abnormality. SOFT TISSUES AND SKULL: No acute skull fracture. No acute soft tissue abnormality. IMPRESSION: 1. No acute intracranial hemorrhage or acute territorial infarction. 2. The above finding was communicated via the Am[employer] text paging service to Dr. Voncile. 3. Nonspecific subcentimeter focus of hypoattenuation in the region of the genu of the corpus callosum may represent chronic infarct, inflammatory/demyelinating disease, or metabolic etiology. MRI brain would be useful to further assess. Electronically signed by: prentice spade 10/07/2024 02:01 PM EST RP Workstation: [id number]   CT ANGIO HEAD NECK W WO CM (CODE STROKE) Result Date: 10/07/2024 CLINICAL DATA:  Headache, loss of vision EXAM: CT ANGIOGRAPHY HEAD AND NECK WITH AND WITHOUT CONTRAST TECHNIQUE: Multidetector CT imaging of the head and neck was performed using the standard protocol during bolus administration of intravenous contrast.  Multiplanar CT image reconstructions and MIPs were obtained to evaluate the vascular anatomy. Carotid stenosis measurements (when applicable) are obtained utilizing NASCET criteria, using the distal internal carotid diameter as the denominator. RADIATION DOSE REDUCTION: This exam was performed according to the departmental dose-optimization program which includes automated exposure control, adjustment of the mA and/or kV according to patient size and/or use of iterative reconstruction technique. CONTRAST:  75mL OMNIPAQUE  IOHEXOL  350 MG/ML SOLN COMPARISON:  None Available. CTA NECK: CTA NECK Aortic arch: No proximal vessel stenosis. Right carotid: Normal Left carotid: Normal Right vertebral: Normal Left vertebral: Normal Soft tissues: No significant abnormality Other comments: None CTA HEAD: CTA HEAD Right anterior circulation: The internal carotid artery is patent without significant stenosis. The anterior and middle cerebral arteries are patent without significant stenosis or proximal branch occlusion. No aneurysm. Left anterior circulation: The internal carotid artery is patent without significant stenosis. The anterior and middle cerebral arteries are patent without significant stenosis or proximal branch occlusion. No aneurysm. Posterior circulation: Both vertebral arteries  are patent. The right is dominant. There is a occlusion of the left posterior cerebral artery. IMPRESSION: Left posterior cerebral artery occlusion No carotid artery stenosis on either side Electronically Signed   By: Nancyann Burns M.D.   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  Acute TIA/CVA involving left posterior cerebral artery occlusion --Neuro following; appreciate eval/recs below: -Telemetry -Activate a code stroke if he has visual field deficits again -Load with  aspirin  650 and Plavix  300 x 1. -Start aspirin  81+ Plavix  75 from tomorrow. -High intensity statin for goal LDL less than 70 -Check A1c and lipid panel -MRI brain without contrast -2D echo -Therapy assessments -Permissive hypertension-treat only if systolic blood pressures greater than 220 on a as needed basis.  HTN -HOLD pta losartan, hydrochlorothiazide, and lasix to allow permissive HTN per Neuro recs above  HLD -PTA atorvastatin  40mg  daily -F/u lipid panel and increase if needed; defer to Neuro  Tobacco use -Cessation counseling  OSA -CPAP at bedtime while admitted -OP polysomnography per PCP on d/c    Advance Care Planning:   Code Status: Full Code   Consults: Neurology  Family Communication: Mother  Severity of Illness: The appropriate patient status for this patient is INPATIENT. Inpatient status is judged to be reasonable and necessary in order to provide the required intensity of service to ensure the patient's safety. The patient's presenting symptoms, physical exam findings, and initial radiographic and laboratory data in the context of their chronic comorbidities is felt to place them at high risk for further clinical deterioration. Furthermore, it is not anticipated that the patient will be medically stable for discharge from the hospital within 2 midnights of admission.   * I certify that at the point of admission it is my clinical judgment that the patient will require inpatient hospital care spanning beyond 2 midnights from the point of admission due to high intensity of service, high risk for further deterioration and high frequency of surveillance required.*   ------- I spent 56 minutes reviewing previous notes, at the bedside counseling/discussing the treatment plan, and performing clinical documentation.  Author: Marsha Ada, MD 10/07/2024 3:03 PM  For on call review [email or web address].

## 2024-10-08 ENCOUNTER — Other Ambulatory Visit (HOSPITAL_COMMUNITY): Payer: Self-pay

## 2024-10-08 ENCOUNTER — Inpatient Hospital Stay (HOSPITAL_COMMUNITY)

## 2024-10-08 DIAGNOSIS — N179 Acute kidney failure, unspecified: Secondary | ICD-10-CM | POA: Diagnosis not present

## 2024-10-08 DIAGNOSIS — N189 Chronic kidney disease, unspecified: Secondary | ICD-10-CM | POA: Diagnosis not present

## 2024-10-08 DIAGNOSIS — G459 Transient cerebral ischemic attack, unspecified: Secondary | ICD-10-CM | POA: Diagnosis not present

## 2024-10-08 DIAGNOSIS — I129 Hypertensive chronic kidney disease with stage 1 through stage 4 chronic kidney disease, or unspecified chronic kidney disease: Secondary | ICD-10-CM | POA: Diagnosis not present

## 2024-10-08 DIAGNOSIS — F1210 Cannabis abuse, uncomplicated: Secondary | ICD-10-CM

## 2024-10-08 DIAGNOSIS — I63532 Cerebral infarction due to unspecified occlusion or stenosis of left posterior cerebral artery: Secondary | ICD-10-CM | POA: Diagnosis not present

## 2024-10-08 DIAGNOSIS — F17210 Nicotine dependence, cigarettes, uncomplicated: Secondary | ICD-10-CM

## 2024-10-08 LAB — ECHOCARDIOGRAM COMPLETE
AR max vel: 3.66 cm2
AV Area VTI: 3.71 cm2
AV Area mean vel: 3.39 cm2
AV Mean grad: 4 mmHg
AV Peak grad: 6.3 mmHg
Ao pk vel: 1.25 m/s
Area-P 1/2: 3.27 cm2
Calc EF: 68.4 %
Height: 72 in
MV VTI: 3.04 cm2
S' Lateral: 2.7 cm
Single Plane A2C EF: 77.8 %
Single Plane A4C EF: 57 %
Weight: 3534.41 [oz_av]

## 2024-10-08 LAB — CBC
HCT: 43.6 % (ref 39.0–52.0)
Hemoglobin: 14.9 g/dL (ref 13.0–17.0)
MCH: 31.6 pg (ref 26.0–34.0)
MCHC: 34.2 g/dL (ref 30.0–36.0)
MCV: 92.4 fL (ref 80.0–100.0)
Platelets: 220 K/uL (ref 150–400)
RBC: 4.72 MIL/uL (ref 4.22–5.81)
RDW: 13.2 % (ref 11.5–15.5)
WBC: 10.3 K/uL (ref 4.0–10.5)
nRBC: 0 % (ref 0.0–0.2)

## 2024-10-08 LAB — BASIC METABOLIC PANEL WITH GFR
Anion gap: 8 (ref 5–15)
BUN: 25 mg/dL — ABNORMAL HIGH (ref 6–20)
CO2: 29 mmol/L (ref 22–32)
Calcium: 8.1 mg/dL — ABNORMAL LOW (ref 8.9–10.3)
Chloride: 99 mmol/L (ref 98–111)
Creatinine, Ser: 1.4 mg/dL — ABNORMAL HIGH (ref 0.61–1.24)
GFR, Estimated: 59 mL/min — ABNORMAL LOW (ref >60)
Glucose, Bld: 100 mg/dL — ABNORMAL HIGH (ref 70–99)
Potassium: 2.8 mmol/L — ABNORMAL LOW (ref 3.5–5.1)
Sodium: 136 mmol/L (ref 135–145)

## 2024-10-08 LAB — HEMOGLOBIN A1C
Hgb A1c MFr Bld: 5.3 % (ref 4.8–5.6)
Mean Plasma Glucose: 105.41 mg/dL

## 2024-10-08 LAB — LIPID PANEL
Cholesterol: 115 mg/dL (ref 0–200)
HDL: 32 mg/dL — ABNORMAL LOW (ref >40)
LDL Cholesterol: 58 mg/dL (ref 0–99)
Total CHOL/HDL Ratio: 3.6 ratio
Triglycerides: 126 mg/dL (ref <150)
VLDL: 25 mg/dL (ref 0–40)

## 2024-10-08 MED ORDER — ASPIRIN 81 MG PO TBEC: 81.0000 mg | DELAYED_RELEASE_TABLET | Freq: Every day | ORAL | 12 refills | Status: AC

## 2024-10-08 MED ORDER — ASPIRIN 81 MG PO TBEC
81.0000 mg | DELAYED_RELEASE_TABLET | Freq: Every day | ORAL | Status: DC
  Administered 2024-10-08: 81 mg via ORAL
  Filled 2024-10-08: qty 1

## 2024-10-08 MED ORDER — ASPIRIN 81 MG PO TBEC
81.0000 mg | DELAYED_RELEASE_TABLET | Freq: Every day | ORAL | 12 refills | Status: AC
  Filled 2024-10-08 (×2): qty 30, 30d supply, fill #0

## 2024-10-08 MED ORDER — CLOPIDOGREL BISULFATE 75 MG PO TABS
75.0000 mg | ORAL_TABLET | Freq: Every day | ORAL | Status: DC
  Administered 2024-10-08: 75 mg via ORAL
  Filled 2024-10-08: qty 1

## 2024-10-08 MED ORDER — CLOPIDOGREL BISULFATE 75 MG PO TABS
75.0000 mg | ORAL_TABLET | Freq: Every day | ORAL | 0 refills | Status: DC
  Filled 2024-10-08: qty 90, 90d supply, fill #0

## 2024-10-08 MED ORDER — CLOPIDOGREL BISULFATE 75 MG PO TABS: 75.0000 mg | ORAL_TABLET | Freq: Every day | ORAL | 0 refills | Status: AC

## 2024-10-08 MED ORDER — PERFLUTREN LIPID MICROSPHERE
1.0000 mL | INTRAVENOUS | Status: AC | PRN
  Administered 2024-10-08: 2 mL via INTRAVENOUS

## 2024-10-08 NOTE — Discharge Summary (Signed)
 Physician Discharge Summary  Matthew Valencia FMW:[id number] DOB: 1965-12-02 DOA: 10/07/2024  PCP: Ransom Other, MD  Admit date: 10/07/2024 Discharge date: 10/08/2024  Admitted From: Home Disposition: Home  Recommendations for Outpatient Follow-up:  Follow up with PCP in 1-2 weeks Follow-up with neurology as scheduled  Home Health: None Equipment/Devices: None  Discharge Condition: Stable CODE STATUS: Full Diet recommendation: Low-salt low-fat low-carb diet  Brief/Interim Summary: Matthew Valencia is a 58 y.o. 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  Patient evaluated by neurology, given resolution of symptoms negative echo and otherwise unremarkable findings current recommendations are to continue dual antiplatelet treatment for 90 days with Plavix  and aspirin , after 90 days patient is to transition to aspirin  alone as a single agent.  Continue high intensity statin as well as lengthy discussion in regards to patient's lifestyle and risk factors, counseled heavily on smoking cessation.  At this time patient otherwise stable and agreeable for discharge home, close follow-up with PCP and neurology as scheduled, patient educated to return back to the hospital should he have any recurrent episodes or symptoms of vision changes or strokelike symptoms given his reports of having transient vision changes weeks ago at which time he did not seek medical care.    Discharge Diagnoses:  Principal Problem:   CVA (cerebral vascular accident) [facility])    Discharge Instructions  Discharge Instructions     Call MD for:  difficulty breathing, headache or visual disturbances   Complete by: As directed    Call MD for:  extreme fatigue   Complete by: As directed    Call MD for:  hives   Complete by: As directed    Call MD for:  persistant dizziness or  light-headedness   Complete by: As directed    Call MD for:  persistant nausea and vomiting   Complete by: As directed    Call MD for:  severe uncontrolled pain   Complete by: As directed    Call MD for:  temperature >100.4   Complete by: As directed    Diet - low sodium heart healthy   Complete by: As directed    Increase activity slowly   Complete by: As directed       Allergies as of 10/08/2024       Reactions   Trazodone Hcl    Other reaction(s): lip swelling        Medication List     TAKE these medications    albuterol 108 (90 Base) MCG/ACT inhaler Commonly known as: VENTOLIN HFA Inhale 1 puff into the lungs every 6 (six) hours as needed for shortness of breath. As Needed   aspirin  EC 81 MG tablet Take 1 tablet (81 mg total) by mouth daily. Swallow whole. Start taking on: October 09, 2024   atorvastatin  40 MG tablet Commonly known as: LIPITOR Take 1 tablet (40 mg total) by mouth daily.   clopidogrel  75 MG tablet Commonly known as: PLAVIX  Take 1 tablet (75 mg total) by mouth daily. Start taking on: October 09, 2024   furosemide 20 MG tablet Commonly known as: LASIX Take 20 mg by mouth daily.   losartan-hydrochlorothiazide 50-12.5 MG tablet Commonly known as: HYZAAR Take 1 tablet by mouth daily.   meloxicam 15 MG tablet Commonly known as: MOBIC Take 15 mg by mouth daily.        Allergies  Allergen  Reactions   Trazodone Hcl     Other reaction(s): lip swelling    Consultations: Neurology  Procedures/Studies: ECHOCARDIOGRAM COMPLETE Result Date: 10/08/2024    ECHOCARDIOGRAM REPORT   Patient Name:   Matthew Valencia Date of Exam: 10/08/2024 Medical Rec #:  [id number]       Height:       72.0 in Accession #:    [id number]      Weight:       220.9 lb Date of Birth:  [birth year]      BSA:          2.222 m Patient Age:    57 years        BP:           148/88 mmHg Patient Gender: M               HR:           59 bpm. Exam Location:  Inpatient  Procedure: 2D Echo and Intracardiac Opacification Agent (Both Spectral and Color            Flow Doppler were utilized during procedure). Indications:    Stroke  History:        Patient has prior history of Echocardiogram examinations.                 Stroke.  Sonographer:    Matthew Valencia Referring Phys: ELIGIO LAV  Sonographer Comments: Dextrocardia IMPRESSIONS  1. Location of heart is mostly in right chest wall due to elevated left hemidiaphragm. Left ventricular ejection fraction, by estimation, is 60 to 65%. The left ventricle has normal function. The left ventricle has no regional wall motion abnormalities.  Left ventricular diastolic parameters were normal.  2. Right ventricular systolic function is normal. The right ventricular size is normal.  3. The mitral valve is normal in structure. No evidence of mitral valve regurgitation. No evidence of mitral stenosis.  4. The aortic valve is normal in structure. Aortic valve regurgitation is not visualized. No aortic stenosis is present.  5. The inferior vena cava is normal in size with greater than 50% respiratory variability, suggesting right atrial pressure of 3 mmHg. Conclusion(s)/Recommendation(s): No intracardiac source of embolism detected on this transthoracic study. Consider a transesophageal echocardiogram to exclude cardiac source of embolism if clinically indicated. FINDINGS  Left Ventricle: Location of heart is mostly in right chest wall due to elevated left hemidiaphragm. Left ventricular ejection fraction, by estimation, is 60 to 65%. The left ventricle has normal function. The left ventricle has no regional wall motion abnormalities. The left ventricular internal cavity size was normal in size. There is no left ventricular hypertrophy. Left ventricular diastolic parameters were normal. Right Ventricle: The right ventricular size is normal. No increase in right ventricular wall thickness. Right ventricular systolic function is normal. Left Atrium:  Left atrial size was normal in size. Right Atrium: Right atrial size was normal in size. Pericardium: There is no evidence of pericardial effusion. Mitral Valve: The mitral valve is normal in structure. No evidence of mitral valve regurgitation. No evidence of mitral valve stenosis. MV peak gradient, 2.7 mmHg. The mean mitral valve gradient is 1.0 mmHg. Tricuspid Valve: The tricuspid valve is normal in structure. Tricuspid valve regurgitation is not demonstrated. No evidence of tricuspid stenosis. Aortic Valve: The aortic valve is normal in structure. Aortic valve regurgitation is not visualized. No aortic stenosis is present. Aortic valve mean gradient measures 4.0 mmHg. Aortic valve peak gradient measures 6.2 mmHg. Aortic valve  area, by VTI measures 3.71 cm. Pulmonic Valve: The pulmonic valve was normal in structure. Pulmonic valve regurgitation is not visualized. No evidence of pulmonic stenosis. Aorta: The aortic root is normal in size and structure. Venous: The inferior vena cava is normal in size with greater than 50% respiratory variability, suggesting right atrial pressure of 3 mmHg. IAS/Shunts: No atrial level shunt detected by color flow Doppler.  LEFT VENTRICLE PLAX 2D LVIDd:         4.70 cm     Diastology LVIDs:         2.70 cm     LV e' medial:    10.40 cm/s LV PW:         0.90 cm     LV E/e' medial:  7.2 LV IVS:        0.80 cm     LV e' lateral:   8.81 cm/s LVOT diam:     2.40 cm     LV E/e' lateral: 8.4 LV SV:         89 LV SV Index:   40 LVOT Area:     4.52 cm  LV Volumes (MOD) LV vol d, MOD A2C: 61.8 ml LV vol d, MOD A4C: 58.8 ml LV vol s, MOD A2C: 13.7 ml LV vol s, MOD A4C: 25.3 ml LV SV MOD A2C:     48.1 ml LV SV MOD A4C:     58.8 ml LV SV MOD BP:      42.8 ml RIGHT VENTRICLE             IVC RV Basal diam:  3.10 cm     IVC diam: 1.50 cm RV S prime:     13.90 cm/s TAPSE (M-mode): 1.9 cm      PULMONARY VEINS                             Diastolic Velocity: 22.40 cm/s                             S/D  Velocity:       1.40                             Systolic Velocity:  30.90 cm/s LEFT ATRIUM             Index        RIGHT ATRIUM          Index LA diam:        3.20 cm 1.44 cm/m   RA Area:     8.18 cm LA Vol (A2C):   34.4 ml 15.48 ml/m  RA Volume:   15.50 ml 6.97 ml/m LA Vol (A4C):   26.7 ml 12.01 ml/m LA Biplane Vol: 33.4 ml 15.03 ml/m  AORTIC VALVE                    PULMONIC VALVE AV Area (Vmax):    3.66 cm     PV Vmax:       1.03 m/s AV Area (Vmean):   3.39 cm     PV Peak grad:  4.2 mmHg AV Area (VTI):     3.71 cm AV Vmax:           125.00 cm/s AV Vmean:          89.700  cm/s AV VTI:            0.239 m AV Peak Grad:      6.2 mmHg AV Mean Grad:      4.0 mmHg LVOT Vmax:         101.00 cm/s LVOT Vmean:        67.300 cm/s LVOT VTI:          0.196 m LVOT/AV VTI ratio: 0.82  AORTA Ao Root diam: 2.70 cm Ao Asc diam:  2.90 cm MITRAL VALVE MV Area (PHT): 3.27 cm    SHUNTS MV Area VTI:   3.04 cm    Systemic VTI:  0.20 m MV Peak grad:  2.7 mmHg    Systemic Diam: 2.40 cm MV Mean grad:  1.0 mmHg MV Vmax:       0.82 m/s MV Vmean:      47.9 cm/s MV Decel Time: 232 msec MV E velocity: 74.40 cm/s MV A velocity: 88.00 cm/s MV E/A ratio:  0.85 Oneil Parchment MD Electronically signed by Oneil Parchment MD Signature Date/Time: 10/08/2024/1:15:41 PM    Final    MR BRAIN WO CONTRAST Result Date: 10/07/2024 EXAM: MRI BRAIN WITHOUT CONTRAST 10/07/2024 06:34:20 PM TECHNIQUE: Multiplanar multisequence MRI of the head/brain was performed without the administration of intravenous contrast. COMPARISON: Same day CT head and CTA head and neck. CLINICAL HISTORY: Neuro deficit, acute, stroke suspected. FINDINGS: BRAIN AND VENTRICLES: No acute infarct. No intracranial hemorrhage. No mass. No midline shift. No hydrocephalus. Focus of hypoattenuation at the genu of the corpus callosum noted on CT corresponds to a small cavum septum pellucidum. Scattered areas of T2 and FLAIR hyperintensity in the periventricular and subcortical white matter  compatible with mild chronic microvascular ischemic changes. The sella is unremarkable. Normal flow voids. ORBITS: No acute abnormality. SINUSES AND MASTOIDS: Right mastoid effusion. BONES AND SOFT TISSUES: Normal marrow signal. No acute soft tissue abnormality. IMPRESSION: 1. No acute infarct in the left PCA territory or elsewhere. 2. Small cavum septum pellucidum corresponding to area of concern along the corpus callosum on CT. 3. Mild chronic microvascular ischemic changes. 4. Right mastoid effusion. Electronically signed by: Donnice Mania MD 10/07/2024 07:38 PM EST RP Workstation: [id number]   CT HEAD CODE STROKE WO CONTRAST (LKW 0-4.5h, LVO 0-24h) Result Date: 10/07/2024 EXAM: CT HEAD WITHOUT 10/07/2024 01:14:04 PM TECHNIQUE: CT of the head was performed without the administration of intravenous contrast. Automated exposure control, iterative reconstruction, and/or weight based adjustment of the mA/kV was utilized to reduce the radiation dose to as low as reasonably achievable. COMPARISON: None available. CLINICAL HISTORY: Neuro deficit, acute, stroke suspected. FINDINGS: BRAIN AND VENTRICLES: There is subcentimeter focus of hypoattenuation in the region of the genu of the corpus callosum (series 3, image 18). No acute intracranial hemorrhage. No mass effect or midline shift. No extra-axial fluid collection. No evidence of acute infarct. No hydrocephalus. ORBITS: No acute abnormality. SINUSES AND MASTOIDS: Leftward nasal septal deviation. No acute abnormality. SOFT TISSUES AND SKULL: No acute skull fracture. No acute soft tissue abnormality. IMPRESSION: 1. No acute intracranial hemorrhage or acute territorial infarction. 2. The above finding was communicated via the Amion text paging service to Dr. Voncile. 3. Nonspecific subcentimeter focus of hypoattenuation in the region of the genu of the corpus callosum may represent chronic infarct, inflammatory/demyelinating disease, or metabolic etiology. MRI brain  would be useful to further assess. Electronically signed by: prentice spade 10/07/2024 02:01 PM EST RP Workstation: [id number]   CT ANGIO HEAD NECK W WO  CM (CODE STROKE) Result Date: 10/07/2024 CLINICAL DATA:  Headache, loss of vision EXAM: CT ANGIOGRAPHY HEAD AND NECK WITH AND WITHOUT CONTRAST TECHNIQUE: Multidetector CT imaging of the head and neck was performed using the standard protocol during bolus administration of intravenous contrast. Multiplanar CT image reconstructions and MIPs were obtained to evaluate the vascular anatomy. Carotid stenosis measurements (when applicable) are obtained utilizing NASCET criteria, using the distal internal carotid diameter as the denominator. RADIATION DOSE REDUCTION: This exam was performed according to the departmental dose-optimization program which includes automated exposure control, adjustment of the mA and/or kV according to patient size and/or use of iterative reconstruction technique. CONTRAST:  75mL OMNIPAQUE  IOHEXOL  350 MG/ML SOLN COMPARISON:  None Available. CTA NECK: CTA NECK Aortic arch: No proximal vessel stenosis. Right carotid: Normal Left carotid: Normal Right vertebral: Normal Left vertebral: Normal Soft tissues: No significant abnormality Other comments: None CTA HEAD: CTA HEAD Right anterior circulation: The internal carotid artery is patent without significant stenosis. The anterior and middle cerebral arteries are patent without significant stenosis or proximal branch occlusion. No aneurysm. Left anterior circulation: The internal carotid artery is patent without significant stenosis. The anterior and middle cerebral arteries are patent without significant stenosis or proximal branch occlusion. No aneurysm. Posterior circulation: Both vertebral arteries are patent. The right is dominant. There is a occlusion of the left posterior cerebral artery. IMPRESSION: Left posterior cerebral artery occlusion No carotid artery stenosis on either side  Electronically Signed   By: Nancyann Burns M.D.   On: 10/07/2024 13:47     Subjective: No acute issues or events overnight denies nausea vomiting diarrhea constipation headache fevers chills or chest pain   Discharge Exam: Vitals:   10/08/24 1000 10/08/24 1115  BP: (!) 148/78   Pulse: (!) 57   Resp: 16   Temp:  98.3 F (36.8 C)  SpO2: 100%    Vitals:   10/08/24 0630 10/08/24 0716 10/08/24 1000 10/08/24 1115  BP:   (!) 148/78   Pulse: (!) 51  (!) 57   Resp: 15  16   Temp:  97.6 F (36.4 C)  98.3 F (36.8 C)  TempSrc:  Oral  Oral  SpO2: (!) 89%  100%   Weight:      Height:        General: Pt is alert, awake, not in acute distress Cardiovascular: RRR, S1/S2 +, no rubs, no gallops Respiratory: CTA bilaterally, no wheezing, no rhonchi Abdominal: Soft, NT, ND, bowel sounds + Extremities: no edema, no cyanosis    The results of significant diagnostics from this hospitalization (including imaging, microbiology, ancillary and laboratory) are listed below for reference.     Microbiology: No results found for this or any previous visit (from the past 240 hours).   Labs: BNP (last 3 results) No results for input(s): BNP in the last 8760 hours. Basic Metabolic Panel: Recent Labs  Lab 10/07/24 1327 10/07/24 1331 10/08/24 0116  NA 137 136 136  K 3.5 3.6 2.8*  CL 94* 96* 99  CO2  --  26 29  GLUCOSE 89 92 100*  BUN 31* 23* 25*  CREATININE 2.00* 2.01* 1.40*  CALCIUM   --  9.0 8.1*   Liver Function Tests: Recent Labs  Lab 10/07/24 1331  AST 19  ALT 17  ALKPHOS 81  BILITOT 1.1  PROT 6.9  ALBUMIN 3.9   No results for input(s): LIPASE, AMYLASE in the last 168 hours. No results for input(s): AMMONIA in the last 168 hours. CBC: Recent  Labs  Lab 10/07/24 1327 10/07/24 1331 10/08/24 0116  WBC  --  16.3* 10.3  NEUTROABS  --  11.3*  --   HGB 16.0 15.9 14.9  HCT 47.0 47.2 43.6  MCV  --  93.1 92.4  PLT  --  293 220   Cardiac Enzymes: No results for  input(s): CKTOTAL, CKMB, CKMBINDEX, TROPONINI in the last 168 hours. BNP: Invalid input(s): POCBNP CBG: Recent Labs  Lab 10/07/24 1259  GLUCAP 103*   D-Dimer No results for input(s): DDIMER in the last 72 hours. Hgb A1c Recent Labs    10/08/24 0116  HGBA1C 5.3   Lipid Profile Recent Labs    10/08/24 0116  CHOL 115  HDL 32*  LDLCALC 58  TRIG 873  CHOLHDL 3.6   Thyroid function studies No results for input(s): TSH, T4TOTAL, T3FREE, THYROIDAB in the last 72 hours.  Invalid input(s): FREET3 Anemia work up No results for input(s): VITAMINB12, FOLATE, FERRITIN, TIBC, IRON, RETICCTPCT in the last 72 hours. Urinalysis No results found for: COLORURINE, APPEARANCEUR, LABSPEC, PHURINE, GLUCOSEU, HGBUR, BILIRUBINUR, KETONESUR, PROTEINUR, UROBILINOGEN, NITRITE, LEUKOCYTESUR Sepsis Labs Recent Labs  Lab 10/07/24 1331 10/08/24 0116  WBC 16.3* 10.3   Microbiology No results found for this or any previous visit (from the past 240 hours).   Time coordinating discharge: Over 30 minutes  SIGNED:   Elsie JAYSON [place], DO [facility] 10/08/2024, 2:30 PM Pager   If 7PM-7AM, please contact night-coverage [email or web address]

## 2024-10-08 NOTE — Progress Notes (Signed)
° °  Brief Progress Note   _____________________________________________________________________________________________________________  Patient Name: Matthew Valencia Patient DOB: Vivia Rosenburg 19, 1967 Date: @TODAY @      Data: Reviewed vital signs, labs, and notes.    Action: No action required at this time.     Response:    _____________________________________________________________________________________________________________  [facility] RN Expeditor Virga Haltiwanger S Latoiya Maradiaga Please contact us  directly via secure chat (search for [facility]) or by calling us  at [phone] [facility]).

## 2024-10-08 NOTE — Progress Notes (Signed)
 PT Cancellation Note  Patient Details Name: Matthew Valencia MRN: [id number] DOB: 1965/11/30   Cancelled Treatment:    Reason Eval/Treat Not Completed: PT screened, no needs identified, will sign off. Pt independent and reports he is totally back to baseline.    Rodgers ORN [facility] 10/08/2024, 12:16 PM Rodgers Opal PT Acute [employer] [phone]  .

## 2024-10-08 NOTE — Progress Notes (Signed)
°  Echocardiogram 2D Echocardiogram has been performed.  Norleen ORN [facility] 10/08/2024, 11:29 AM

## 2024-10-08 NOTE — Telephone Encounter (Signed)
 The [facility] ED charge nurse was called to inform them there was a potential stroke coming to their site. She was informed of the patient's symptoms including right sided deficits, right eye vision deficit, slurred speech, chest pain, and facial drooping. The charge nurse verbalized understanding.

## 2024-10-08 NOTE — Progress Notes (Addendum)
 STROKE TEAM PROGRESS NOTE   SUBJECTIVE (INTERVAL HISTORY)  Pt reports that he had transient R eye vision lost, but on further questioning it is likely that he had R visual field deficits in both eyes. The episode lasted about 30 minutes, and may have had some slurred speech and difficulty walking. Pt also endorses a headache that occurred at this time. The headache was similar to headaches he has had over the last year or two, characterized by pain behind an eye, and radiating to the back of his head. Has a history of migraines, and reports that this headache does not feel like a migraine.    OBJECTIVE Temp:  [97.5 F (36.4 C)-98.3 F (36.8 C)] 98.3 F (36.8 C) (12/10 1115) Pulse Rate:  [51-71] 57 (12/10 1000) Cardiac Rhythm: Normal sinus rhythm (12/10 1025) Resp:  [7-18] 16 (12/10 1000) BP: (93-152)/(60-91) 148/78 (12/10 1000) SpO2:  [89 %-100 %] 100 % (12/10 1000) Weight:  [100.2 kg] 100.2 kg (12/09 1338)  Recent Labs  Lab 10/07/24 1259  GLUCAP 103*   Recent Labs  Lab 10/07/24 1327 10/07/24 1331 10/08/24 0116  NA 137 136 136  K 3.5 3.6 2.8*  CL 94* 96* 99  CO2  --  26 29  GLUCOSE 89 92 100*  BUN 31* 23* 25*  CREATININE 2.00* 2.01* 1.40*  CALCIUM   --  9.0 8.1*   Recent Labs  Lab 10/07/24 1331  AST 19  ALT 17  ALKPHOS 81  BILITOT 1.1  PROT 6.9  ALBUMIN 3.9   Recent Labs  Lab 10/07/24 1327 10/07/24 1331 10/08/24 0116  WBC  --  16.3* 10.3  NEUTROABS  --  11.3*  --   HGB 16.0 15.9 14.9  HCT 47.0 47.2 43.6  MCV  --  93.1 92.4  PLT  --  293 220   No results for input(s): CKTOTAL, CKMB, CKMBINDEX, TROPONINI in the last 168 hours. Recent Labs    10/07/24 1331  LABPROT 13.8  INR 1.0   No results for input(s): COLORURINE, LABSPEC, PHURINE, GLUCOSEU, HGBUR, BILIRUBINUR, KETONESUR, PROTEINUR, UROBILINOGEN, NITRITE, LEUKOCYTESUR in the last 72 hours.  Invalid input(s): APPERANCEUR     Component Value Date/Time   CHOL 115  10/08/2024 0116   TRIG 126 10/08/2024 0116   HDL 32 (L) 10/08/2024 0116   CHOLHDL 3.6 10/08/2024 0116   VLDL 25 10/08/2024 0116   LDLCALC 58 10/08/2024 0116   Lab Results  Component Value Date   HGBA1C 5.3 10/08/2024      Component Value Date/Time   LABOPIA NONE DETECTED 10/07/2024 2318   COCAINSCRNUR NONE DETECTED 10/07/2024 2318   LABBENZ POSITIVE (A) 10/07/2024 2318   AMPHETMU NONE DETECTED 10/07/2024 2318   THCU POSITIVE (A) 10/07/2024 2318   LABBARB NONE DETECTED 10/07/2024 2318    Recent Labs  Lab 10/07/24 1332  ETH <15    I have personally reviewed the radiological images below and agree with the radiology interpretations.  MR BRAIN WO CONTRAST Result Date: 10/07/2024 EXAM: MRI BRAIN WITHOUT CONTRAST 10/07/2024 06:34:20 PM TECHNIQUE: Multiplanar multisequence MRI of the head/brain was performed without the administration of intravenous contrast. COMPARISON: Same day CT head and CTA head and neck. CLINICAL HISTORY: Neuro deficit, acute, stroke suspected. FINDINGS: BRAIN AND VENTRICLES: No acute infarct. No intracranial hemorrhage. No mass. No midline shift. No hydrocephalus. Focus of hypoattenuation at the genu of the corpus callosum noted on CT corresponds to a small cavum septum pellucidum. Scattered areas of T2 and FLAIR hyperintensity in the periventricular and  subcortical white matter compatible with mild chronic microvascular ischemic changes. The sella is unremarkable. Normal flow voids. ORBITS: No acute abnormality. SINUSES AND MASTOIDS: Right mastoid effusion. BONES AND SOFT TISSUES: Normal marrow signal. No acute soft tissue abnormality. IMPRESSION: 1. No acute infarct in the left PCA territory or elsewhere. 2. Small cavum septum pellucidum corresponding to area of concern along the corpus callosum on CT. 3. Mild chronic microvascular ischemic changes. 4. Right mastoid effusion. Electronically signed by: Donnice Mania MD 10/07/2024 07:38 PM EST RP Workstation: [id number]    CT HEAD CODE STROKE WO CONTRAST (LKW 0-4.5h, LVO 0-24h) Result Date: 10/07/2024 EXAM: CT HEAD WITHOUT 10/07/2024 01:14:04 PM TECHNIQUE: CT of the head was performed without the administration of intravenous contrast. Automated exposure control, iterative reconstruction, and/or weight based adjustment of the mA/kV was utilized to reduce the radiation dose to as low as reasonably achievable. COMPARISON: None available. CLINICAL HISTORY: Neuro deficit, acute, stroke suspected. FINDINGS: BRAIN AND VENTRICLES: There is subcentimeter focus of hypoattenuation in the region of the genu of the corpus callosum (series 3, image 18). No acute intracranial hemorrhage. No mass effect or midline shift. No extra-axial fluid collection. No evidence of acute infarct. No hydrocephalus. ORBITS: No acute abnormality. SINUSES AND MASTOIDS: Leftward nasal septal deviation. No acute abnormality. SOFT TISSUES AND SKULL: No acute skull fracture. No acute soft tissue abnormality. IMPRESSION: 1. No acute intracranial hemorrhage or acute territorial infarction. 2. The above finding was communicated via the Amion text paging service to Dr. Voncile. 3. Nonspecific subcentimeter focus of hypoattenuation in the region of the genu of the corpus callosum may represent chronic infarct, inflammatory/demyelinating disease, or metabolic etiology. MRI brain would be useful to further assess. Electronically signed by: prentice spade 10/07/2024 02:01 PM EST RP Workstation: [id number]   CT ANGIO HEAD NECK W WO CM (CODE STROKE) Result Date: 10/07/2024 CLINICAL DATA:  Headache, loss of vision EXAM: CT ANGIOGRAPHY HEAD AND NECK WITH AND WITHOUT CONTRAST TECHNIQUE: Multidetector CT imaging of the head and neck was performed using the standard protocol during bolus administration of intravenous contrast. Multiplanar CT image reconstructions and MIPs were obtained to evaluate the vascular anatomy. Carotid stenosis measurements (when applicable) are obtained  utilizing NASCET criteria, using the distal internal carotid diameter as the denominator. RADIATION DOSE REDUCTION: This exam was performed according to the departmental dose-optimization program which includes automated exposure control, adjustment of the mA and/or kV according to patient size and/or use of iterative reconstruction technique. CONTRAST:  75mL OMNIPAQUE  IOHEXOL  350 MG/ML SOLN COMPARISON:  None Available. CTA NECK: CTA NECK Aortic arch: No proximal vessel stenosis. Right carotid: Normal Left carotid: Normal Right vertebral: Normal Left vertebral: Normal Soft tissues: No significant abnormality Other comments: None CTA HEAD: CTA HEAD Right anterior circulation: The internal carotid artery is patent without significant stenosis. The anterior and middle cerebral arteries are patent without significant stenosis or proximal branch occlusion. No aneurysm. Left anterior circulation: The internal carotid artery is patent without significant stenosis. The anterior and middle cerebral arteries are patent without significant stenosis or proximal branch occlusion. No aneurysm. Posterior circulation: Both vertebral arteries are patent. The right is dominant. There is a occlusion of the left posterior cerebral artery. IMPRESSION: Left posterior cerebral artery occlusion No carotid artery stenosis on either side Electronically Signed   By: Nancyann Burns M.D.   On: 10/07/2024 13:47     PHYSICAL EXAM  Temp:  [97.5 F (36.4 C)-98.3 F (36.8 C)] 98.3 F (36.8 C) (12/10 1115) Pulse Rate:  [51-71]  57 (12/10 1000) Resp:  [7-18] 16 (12/10 1000) BP: (93-152)/(60-91) 148/78 (12/10 1000) SpO2:  [89 %-100 %] 100 % (12/10 1000) Weight:  [100.2 kg] 100.2 kg (12/09 1338)  General - Well nourished, well developed, in no apparent distress.   Mental Status -  Level of arousal and orientation to time, place, and person were intact. Language including expression, naming, repetition, comprehension was assessed and  found intact. Attention span and concentration were normal. Recent and remote memory were intact. Fund of Knowledge was assessed and was intact.  Cranial Nerves II - XII - II - Visual field intact OU. III, IV, VI - Extraocular movements intact. V - Facial sensation intact bilaterally. VII - Facial movement intact bilaterally. VIII - Hearing & vestibular intact bilaterally. X - Palate elevates symmetrically. XI - Chin turning & shoulder shrug intact bilaterally. XII - Tongue protrusion intact.  Gait and Station - deferred.   ASSESSMENT/PLAN Matthew Valencia is a 58 y.o. male with history of HTN, HLD, and Tobacco use admitted for transient R vision loss. Suspect that this was mostly likely a TIA, though migraine with aura was considered given pt history and concurrent headache. Imaging showing L PCA occlusion fits symptomatically and pt will likely benefit from reduction of chronic risk fasctors and ASA + Plavix  for 3 months, followed by ASA.  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  Now symptom resolved.  MRI: no acute infarct CTA head and neck - L PCA Occlusion 2D Echo EF 60-65% LDL 58 HgbA1c 5.3 DX positive for THC and benzo No antithrombotic prior to admission, now on aspirin  81 mg daily and clopidogrel  75 mg daily for 3 months, followed by ASA alone indefinitely  Patient counseled to be compliant with his antithrombotic medications Disposition: Home  Hypertension Continue with home regimen and following with PCP to control BP Avoid low BP Long term BP goal 140/90  Hyperlipidemia Home meds:  Lipitor 40mg  daily LDL 58, goal < 70 Now on home Lipitor 40mg  daily Continue statin at discharge  Tobacco abuse Current smoker Smoking cessation counseling provided Pt is willing to quit  Other Stroke Risk Factors Obstructive sleep apnea, advised to use his CPAP Marijuana abuse, UDS  positive for THC, cessation education provided ?  Migraine, patient stated he had headache 1-2 times per week sharp at back of eyes, denies phonophobia or photophobia  Other medical issues AKI versus CKD, creatinine 2.00->1.40 Leukocytosis WBC 16.3->10.3  Hospital day # 1  D. Penne Mori, DO, PGY1 Neurology Stroke Team 10/08/2024 11:54 AM  ATTENDING NOTE: I reviewed above note and agree with the assessment and plan. Pt was seen and examined.   Wife at bedside.  Patient lying bed, neuro intact.  Stated that he had difficulty seeing right side visual field yesterday, lasting 30 minutes, associate with mild feeling of chest pain and headache.  He did state that he had a headache frequent, 2/week sharp at back of eyes, no photophobia or phonophobia, takes Tylenol no specific medication.  Given his left PCA possible chronic occlusion and multiple uncontrolled risk factors, patient symptoms likely TIA and less likely complicated migraine.  Recommend DAPT for 3 months and then aspirin  alone.  Continue statin.  Follow-up with [facility].  For detailed assessment and plan, please refer to above as I have made changes wherever appropriate.   Neurology will sign off. Please call with questions. Pt will follow  up with stroke clinic NP at [facility] in about 4 weeks. Thanks for the consult.   Ary Cummins, MD PhD Stroke Neurology 10/08/2024 3:42 PM    To contact Stroke Continuity provider, please refer to [email or web address]. After hours, contact General Neurology

## 2024-11-10 ENCOUNTER — Ambulatory Visit: Admitting: Adult Health

## 2024-11-10 ENCOUNTER — Encounter: Payer: Self-pay | Admitting: Adult Health

## 2024-11-10 VITALS — BP 120/67 | HR 62 | Ht 72.0 in | Wt 220.6 lb

## 2024-11-10 DIAGNOSIS — G459 Transient cerebral ischemic attack, unspecified: Secondary | ICD-10-CM | POA: Diagnosis not present

## 2024-11-10 DIAGNOSIS — F17210 Nicotine dependence, cigarettes, uncomplicated: Secondary | ICD-10-CM | POA: Diagnosis not present

## 2024-11-10 DIAGNOSIS — Z8673 Personal history of transient ischemic attack (TIA), and cerebral infarction without residual deficits: Secondary | ICD-10-CM

## 2024-11-10 NOTE — Patient Instructions (Signed)
 Your Plan:  Continue aspirin  81 mg and Plavix  for 3 months then stop Plavix  and continue with aspirin  daily. Blood pressure goal <130/90 Cholesterol LDL goal <70 Diabetes goal A1c <7 Monitor diet and try to exercise   Thank you for coming to see us  at [facility]. I hope we have been able to provide you high quality care today.  You may receive a patient satisfaction survey over the next few weeks. We would appreciate your feedback and comments so that we may continue to improve ourselves and the health of our patients.

## 2024-11-10 NOTE — Progress Notes (Signed)
 "   PATIENT: Matthew Valencia DOB: June 25, 1966  REASON FOR VISIT: follow up HISTORY FROM: patient PRIMARY NEUROLOGIST: Dr. Rosemarie  Chief Complaint  Patient presents with   Follow-up    Patient in room 4 Patient is here for follow up      HISTORY OF PRESENT ILLNESS: Today 11/10/2024   Matthew Valencia is a 59 y.o. male here for hospital follow-up after possible TIA due to left posterior occlusion.  Patient went to the hospital on December 9 for transient right visual loss.  CTA showed a left posterior cerebral artery occlusion which could explain his symptoms due to possible TIA.  Patient symptoms have since resolved.  Denies any other strokelike symptoms.  Blood pressure is in normal range today.  LDL was 58 currently on Lipitor.  Hemoglobin A1c 5.3.  Patient reports that he has been taking Plavix  consistently but has not been taking aspirin  consistently.  He was not aware that he should be taking both.  On hospital admission his drug screen was positive for [facility] but he reports that has been 10 years since he smoked marijuana.  He denies taking CBD Gummies.  He reports he still smokes cigarette.  But he is trying to quit.  He is currently on CPAP therapy.  He returns today for an evaluation.  Imaging:   MRI brain: IMPRESSION: 1. No acute infarct in the left PCA territory or elsewhere. 2. Small cavum septum pellucidum corresponding to area of concern along the corpus callosum on CT. 3. Mild chronic microvascular ischemic changes. 4. Right mastoid effusion.  CTA head/neck:   IMPRESSION: Left posterior cerebral artery occlusion   No carotid artery stenosis on either side    HISTORY (copied from Hospital)Matthew Valencia is a 59 y.o. male with history of HTN, HLD, and Tobacco use admitted for transient R vision loss. Suspect that this was mostly likely a TIA, though migraine with aura was considered given pt history and concurrent headache. Imaging showing L PCA occlusion  fits symptomatically and pt will likely benefit from reduction of chronic risk fasctors and ASA + Plavix  for 3 months, followed by ASA.   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  Now symptom resolved.  MRI: no acute infarct CTA head and neck - L PCA Occlusion 2D Echo EF 60-65% LDL 58 HgbA1c 5.3 DX positive for THC and benzo No antithrombotic prior to admission, now on aspirin  81 mg daily and clopidogrel  75 mg daily for 3 months, followed by ASA alone indefinitely  Patient counseled to be compliant with his antithrombotic medications Disposition: Home   Hypertension Continue with home regimen and following with PCP to control BP Avoid low BP Long term BP goal 140/90   Hyperlipidemia Home meds:  Lipitor 40mg  daily LDL 58, goal < 70 Now on home Lipitor 40mg  daily Continue statin at discharge   Tobacco abuse Current smoker Smoking cessation counseling provided Pt is willing to quit   Other Stroke Risk Factors Obstructive sleep apnea, advised to use his CPAP Marijuana abuse, UDS positive for THC, cessation education provided ?  Migraine, patient stated he had headache 1-2 times per week sharp at back of eyes, denies phonophobia or photophobia   Other medical issues AKI versus CKD, creatinine 2.00->1.40 Leukocytosis WBC 16.3->10.3  REVIEW OF SYSTEMS: Out of a complete 14 system review of symptoms, the patient complains only of the following symptoms, and  all other reviewed systems are negative.  ALLERGIES: Allergies[1]  HOME MEDICATIONS: Outpatient Medications Prior to Visit  Medication Sig Dispense Refill   aspirin  EC 81 MG tablet Take 1 tablet (81 mg total) by mouth daily. Swallow whole. 30 tablet 12   albuterol (VENTOLIN HFA) 108 (90 Base) MCG/ACT inhaler Inhale 1 puff into the lungs every 6 (six) hours as needed for shortness of breath. As Needed (Patient not taking: Reported on  11/10/2024)     aspirin  EC 81 MG tablet Take 1 tablet (81 mg total) by mouth daily. Swallow whole. 30 tablet 12   atorvastatin  (LIPITOR) 40 MG tablet Take 1 tablet (40 mg total) by mouth daily. 90 tablet 3   clopidogrel  (PLAVIX ) 75 MG tablet Take 1 tablet (75 mg total) by mouth daily. 90 tablet 0   furosemide (LASIX) 20 MG tablet Take 20 mg by mouth daily.     losartan-hydrochlorothiazide (HYZAAR) 50-12.5 MG tablet Take 1 tablet by mouth daily.     meloxicam (MOBIC) 15 MG tablet Take 15 mg by mouth daily.     No facility-administered medications prior to visit.    PAST MEDICAL HISTORY: Past Medical History:  Diagnosis Date   Dyslipidemia    Hypertension    Lumbar disc disease    Obesity    Obstructive sleep apnea    Does not use CPAP secondary to cost    PAST SURGICAL HISTORY: Past Surgical History:  Procedure Laterality Date   HEMORROIDECTOMY      FAMILY HISTORY: Family History  Problem Relation Age of Onset   Diabetes Mother    Lung cancer Father    Coronary artery disease Father 68   Heart failure Other    Coronary artery disease Other     SOCIAL HISTORY: Social History   Socioeconomic History   Marital status: Single    Spouse name: Not on file   Number of children: 2   Years of education: Not on file   Highest education level: Not on file  Occupational History   Occupation: Event Organiser: OTHER    Comment: [employer]   Tobacco Use   Smoking status: Every Day    Current packs/day: 0.50    Average packs/day: 0.5 packs/day for 20.0 years (10.0 ttl pk-yrs)    Types: Cigarettes   Smokeless tobacco: Never  Vaping Use   Vaping status: Never Used  Substance and Sexual Activity   Alcohol use: Yes    Comment: Occasionally   Drug use: Never   Sexual activity: Not on file  Other Topics Concern   Not on file  Social History Narrative   Lives with fiance.      Social Drivers of Health   Tobacco Use: High Risk (10/07/2024)   Patient History     Smoking Tobacco Use: Every Day    Smokeless Tobacco Use: Never    Passive Exposure: Not on file  Financial Resource Strain: Not on file  Food Insecurity: Not on file  Transportation Needs: Not on file  Physical Activity: Not on file  Stress: Not on file  Social Connections: Not on file  Intimate Partner Violence: Not on file  Depression (EYV7-0): Not on file  Alcohol Screen: Not on file  Housing: Not on file  Utilities: Not on file  Health Literacy: Not on file      PHYSICAL EXAM  Vitals:   11/10/24 0903  BP: 120/67  Pulse: 62  Weight: 220 lb 9.6 oz (100.1 kg)  Height: 6' (  1.829 m)   Body mass index is 29.92 kg/m.  Generalized: Well developed, in no acute distress   Neurological examination  Mentation: Alert oriented to time, place, history taking. Follows all commands speech and language fluent Cranial nerve II-XII: Pupils were equal round reactive to light. Extraocular movements were full, visual field were full on confrontational test. Facial sensation and strength were normal. Uvula tongue midline. Head turning and shoulder shrug  were normal and symmetric. Motor: The motor testing reveals 5 over 5 strength of all 4 extremities. Good symmetric motor tone is noted throughout.  Sensory: Sensory testing is intact to soft touch on all 4 extremities. No evidence of extinction is noted.  Coordination: Cerebellar testing reveals good finger-nose-finger and heel-to-shin bilaterally.  Gait and station: Gait is normal. Tandem gait is normal. Romberg is negative. No drift is seen.  Reflexes: Deep tendon reflexes are symmetric and normal bilaterally.   DIAGNOSTIC DATA (LABS, IMAGING, TESTING) - I reviewed patient records, labs, notes, testing and imaging myself where available.  Lab Results  Component Value Date   WBC 10.3 10/08/2024   HGB 14.9 10/08/2024   HCT 43.6 10/08/2024   MCV 92.4 10/08/2024   PLT 220 10/08/2024      Component Value Date/Time   NA 136 10/08/2024  0116   K 2.8 (L) 10/08/2024 0116   CL 99 10/08/2024 0116   CO2 29 10/08/2024 0116   GLUCOSE 100 (H) 10/08/2024 0116   BUN 25 (H) 10/08/2024 0116   CREATININE 1.40 (H) 10/08/2024 0116   CALCIUM  8.1 (L) 10/08/2024 0116   PROT 6.9 10/07/2024 1331   ALBUMIN 3.9 10/07/2024 1331   AST 19 10/07/2024 1331   ALT 17 10/07/2024 1331   ALKPHOS 81 10/07/2024 1331   BILITOT 1.1 10/07/2024 1331   GFRNONAA 59 (L) 10/08/2024 0116   GFRAA  11/19/2010 1424    >60        The eGFR has been calculated using the MDRD equation. This calculation has not been validated in all clinical situations. eGFR's persistently <60 mL/min signify possible Chronic Kidney Disease.   Lab Results  Component Value Date   CHOL 115 10/08/2024   HDL 32 (L) 10/08/2024   LDLCALC 58 10/08/2024   TRIG 126 10/08/2024   CHOLHDL 3.6 10/08/2024   Lab Results  Component Value Date   HGBA1C 5.3 10/08/2024   No results found for: VITAMINB12 No results found for: TSH    ASSESSMENT AND PLAN 59 y.o. year old male  has a past medical history of Dyslipidemia, Hypertension, Lumbar disc disease, Obesity, and Obstructive sleep apnea. 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   Discussed secondary stroke prevention measures and importance of close PCP follow up for aggressive stroke risk factor management. I have gone over the pathophysiology of stroke, warning signs and symptoms, risk factors and their management in some detail with instructions to go to the closest emergency room for symptoms of concern. HTN: BP goal <130/90.   HLD: LDL goal <70. Recent LDL 58.  DMII: A1c goal<7.0. Recent A1c 5.3.  Encouraged patient to monitor diet and encouraged exercise Discussed smoking cessation patient is motivated to quit FU with our office  8 to 9 months or sooner if needed   Duwaine Russell, MSN, NP-C 11/10/2024, 9:00 AM [facility] [phone], [address] [place], [place] [address] [phone]  The patient's condition requires frequent monitoring and adjustments in the treatment plan, reflecting the ongoing complexity of care.  This provider is the continuing focal point for all needed services for this condition.      [1]  Allergies Allergen Reactions   Trazodone Hcl     Other reaction(s): lip swelling   "

## 2024-11-13 NOTE — Progress Notes (Signed)
 I agree with the above plan

## 2024-11-20 ENCOUNTER — Telehealth: Payer: Self-pay | Admitting: Adult Health

## 2024-11-20 NOTE — Telephone Encounter (Signed)
 I called the patient and left a voicemail.

## 2024-11-20 NOTE — Telephone Encounter (Signed)
 Pt has returned the call to [name], NP

## 2024-11-24 NOTE — Telephone Encounter (Signed)
I called patient LVM 

## 2025-10-05 ENCOUNTER — Ambulatory Visit: Admitting: Adult Health
